# Patient Record
Sex: Male | Born: 1965 | Race: White | Hispanic: No | Marital: Married | Smoking: Former smoker
Health system: Southern US, Community
[De-identification: ages and names within clinical notes are randomized; demographics above are authoritative.]

## PROBLEM LIST (undated history)

## (undated) DIAGNOSIS — K900 Celiac disease: Secondary | ICD-10-CM

## (undated) DIAGNOSIS — K219 Gastro-esophageal reflux disease without esophagitis: Secondary | ICD-10-CM

## (undated) DIAGNOSIS — I10 Essential (primary) hypertension: Secondary | ICD-10-CM

## (undated) HISTORY — PX: SHOULDER ARTHROSCOPY WITH BICEPS TENDON REPAIR: SHX5674

## (undated) HISTORY — PX: HERNIA REPAIR: SHX51

## (undated) HISTORY — DX: Essential (primary) hypertension: I10

---

## 2001-04-05 ENCOUNTER — Encounter: Payer: Self-pay | Admitting: *Deleted

## 2001-04-05 ENCOUNTER — Encounter: Admission: RE | Admit: 2001-04-05 | Discharge: 2001-04-05 | Payer: Self-pay | Admitting: *Deleted

## 2001-07-01 ENCOUNTER — Ambulatory Visit (HOSPITAL_COMMUNITY): Admission: RE | Admit: 2001-07-01 | Discharge: 2001-07-01 | Payer: Self-pay | Admitting: *Deleted

## 2003-08-18 ENCOUNTER — Emergency Department (HOSPITAL_COMMUNITY): Admission: AD | Admit: 2003-08-18 | Discharge: 2003-08-18 | Payer: Self-pay | Admitting: Family Medicine

## 2004-06-24 ENCOUNTER — Ambulatory Visit (HOSPITAL_COMMUNITY): Admission: RE | Admit: 2004-06-24 | Discharge: 2004-06-24 | Payer: Self-pay | Admitting: Cardiology

## 2008-03-07 ENCOUNTER — Emergency Department (HOSPITAL_COMMUNITY): Admission: EM | Admit: 2008-03-07 | Discharge: 2008-03-07 | Payer: Self-pay | Admitting: Emergency Medicine

## 2008-09-19 ENCOUNTER — Ambulatory Visit (HOSPITAL_COMMUNITY): Admission: RE | Admit: 2008-09-19 | Discharge: 2008-09-19 | Payer: Self-pay | Admitting: *Deleted

## 2008-09-19 ENCOUNTER — Encounter (INDEPENDENT_AMBULATORY_CARE_PROVIDER_SITE_OTHER): Payer: Self-pay | Admitting: *Deleted

## 2008-12-27 ENCOUNTER — Encounter: Admission: RE | Admit: 2008-12-27 | Discharge: 2009-03-27 | Payer: Self-pay | Admitting: Internal Medicine

## 2009-10-17 IMAGING — CR DG CHEST 2V
2 series · 2 of 2 positions shown · non-contrast
Comparison: None

CLINICAL DATA: Chest pain.

CHEST - 2 VIEW

[w chest pa]
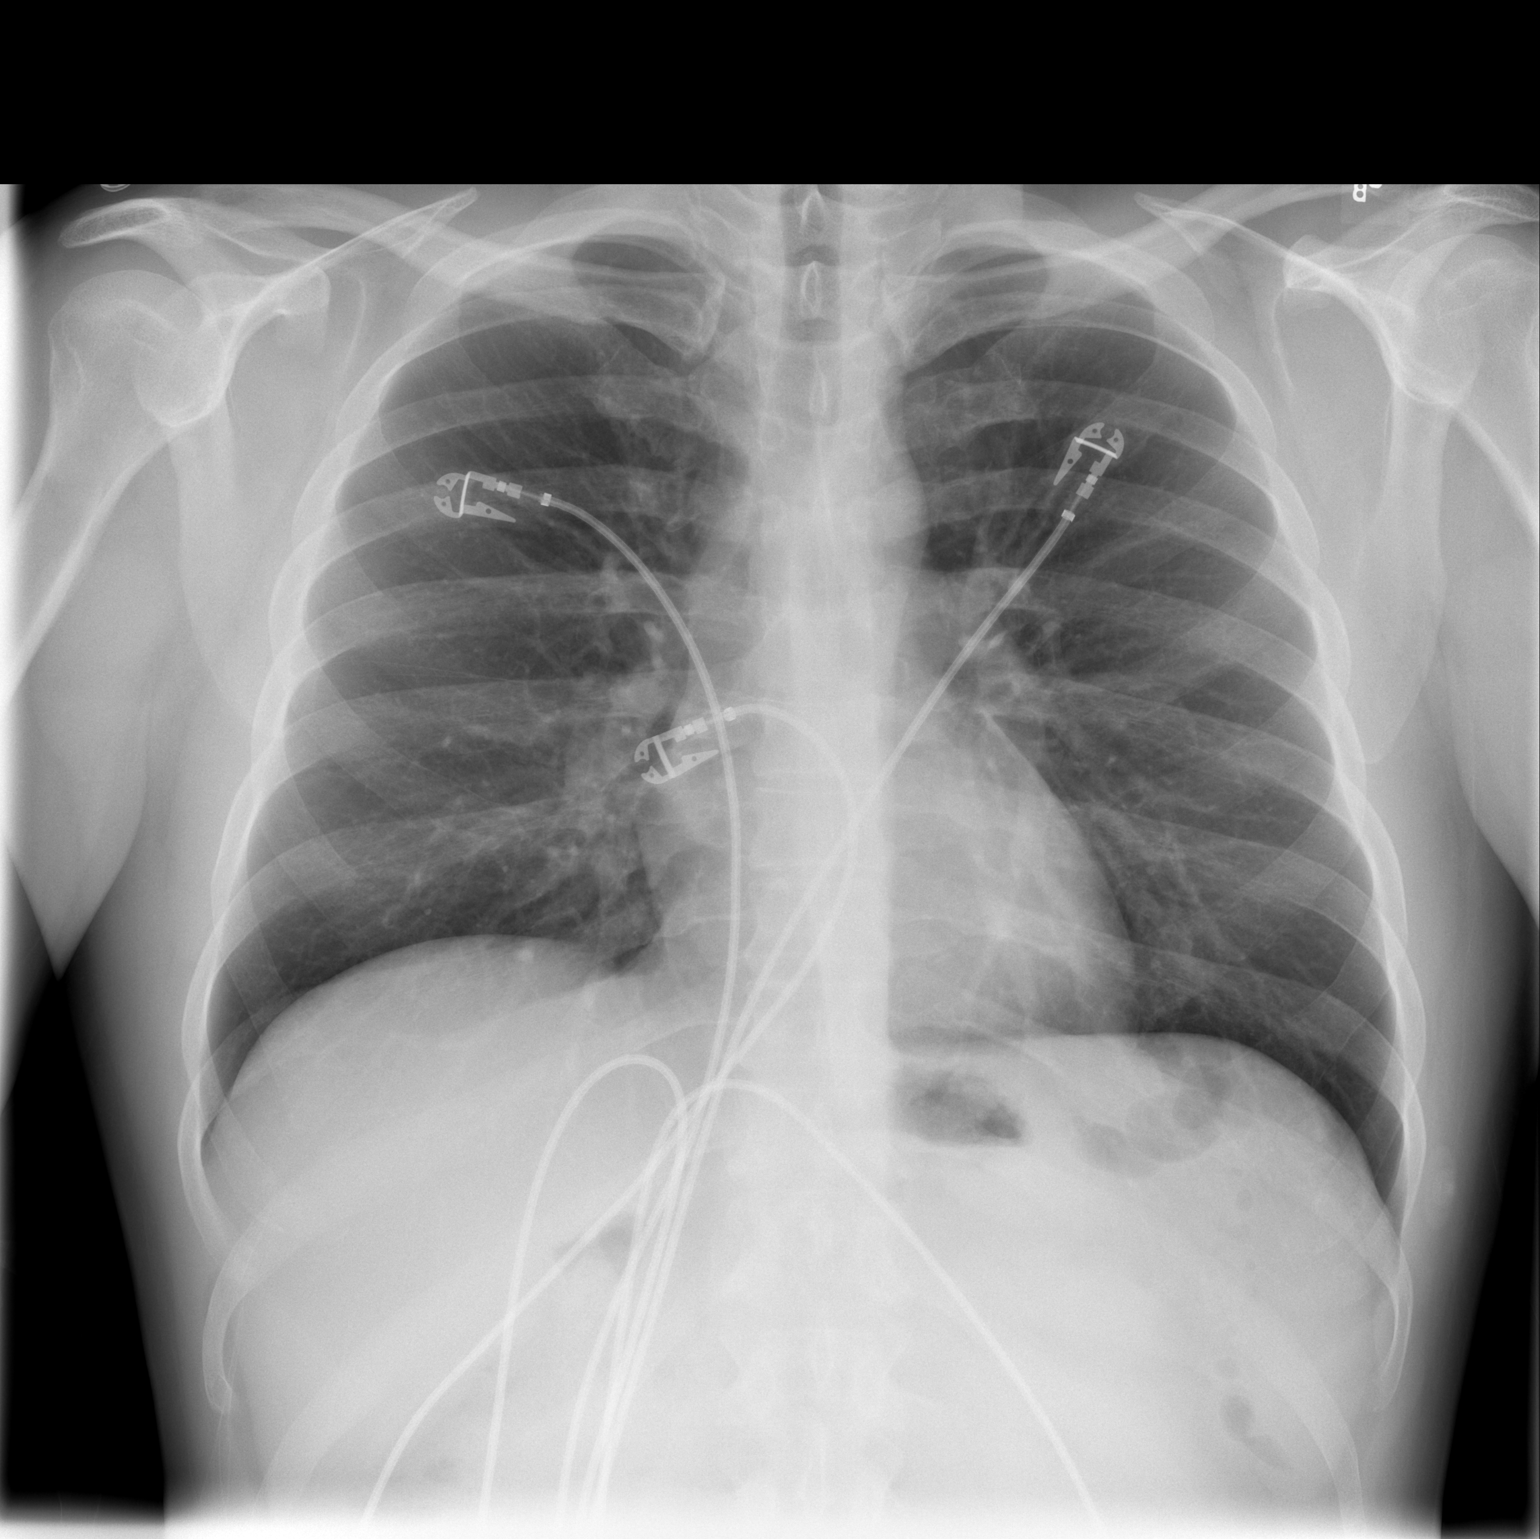

[w chest lat]
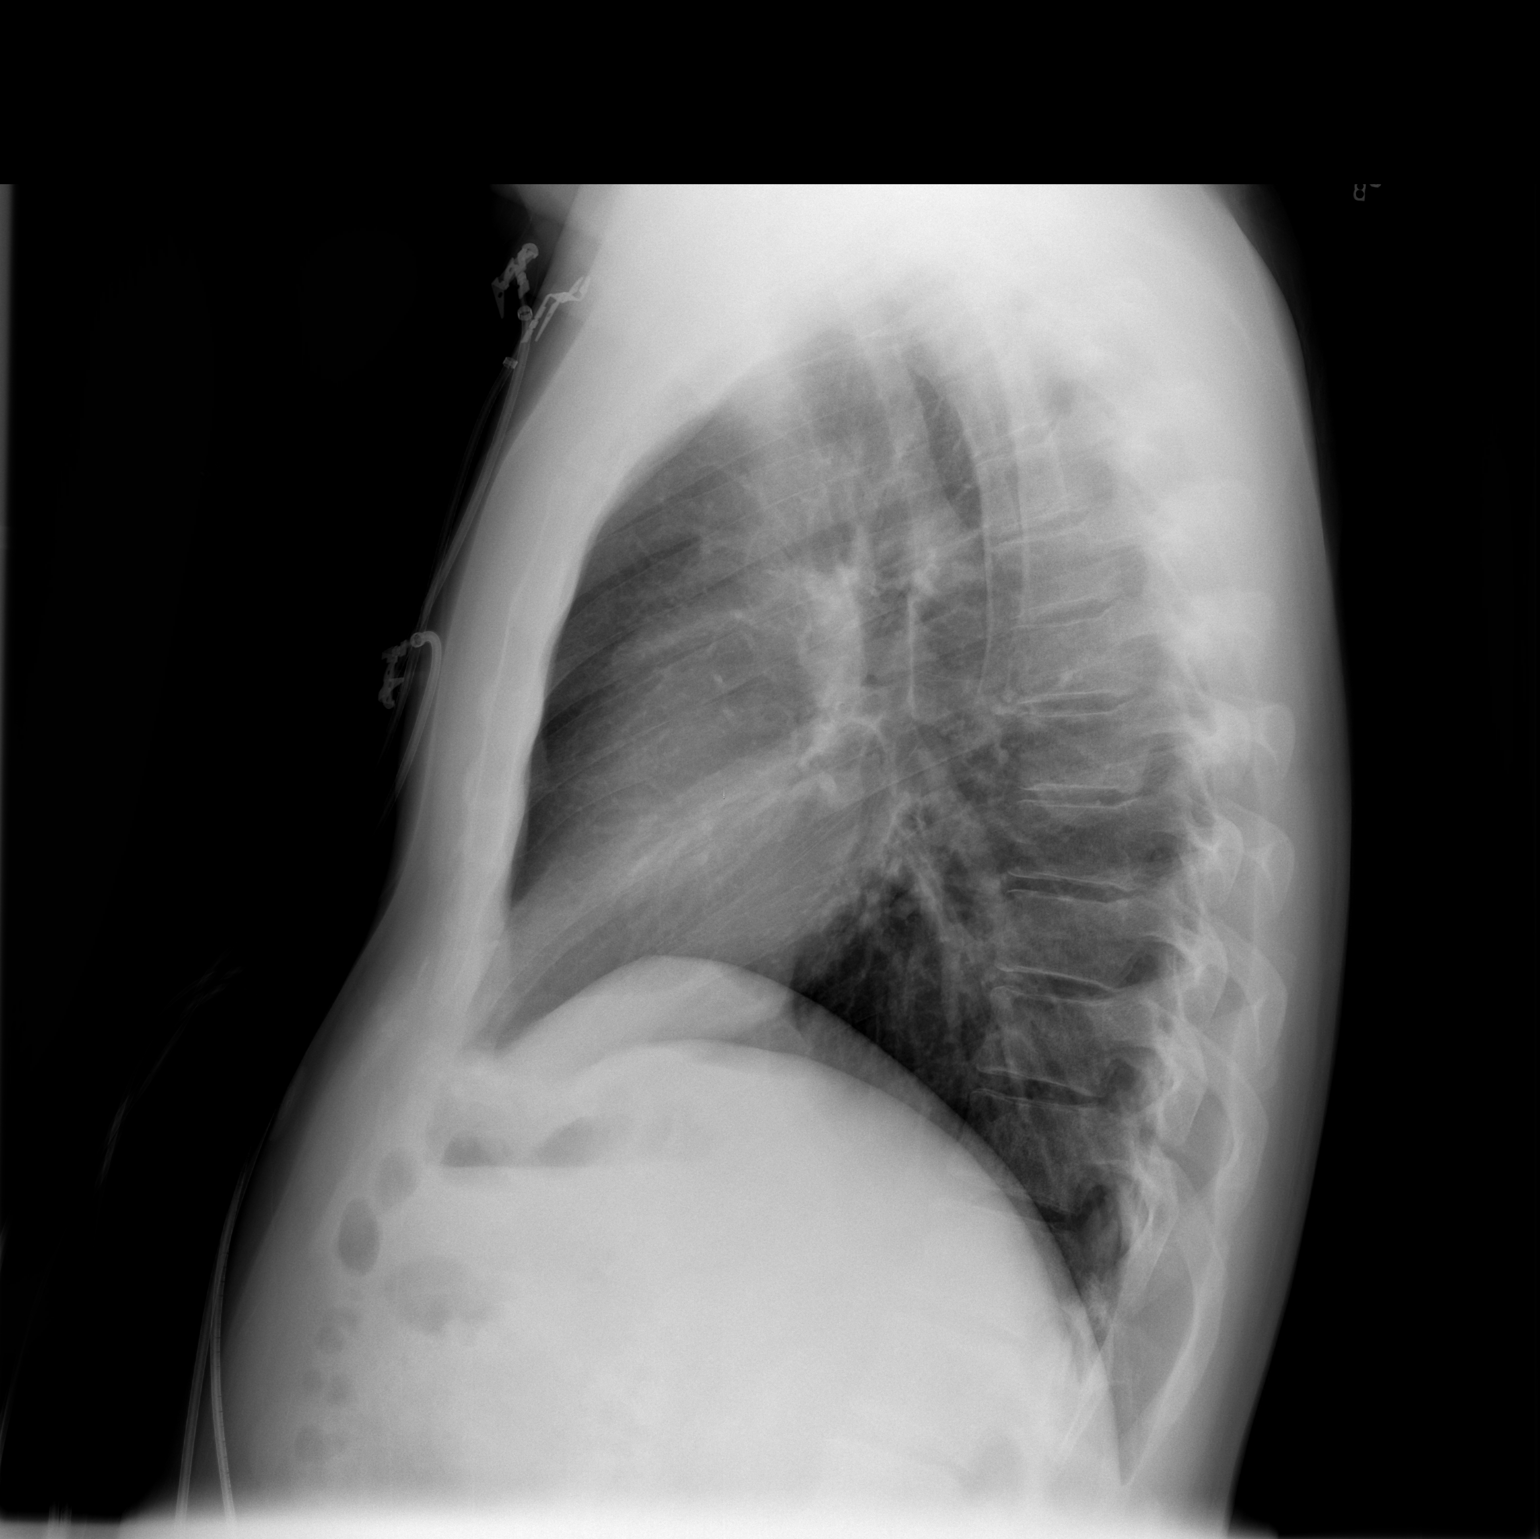

[2 of 2 positions shown; findings below may reference images not displayed]

FINDINGS: The heart size and mediastinal contours are within normal
limits.  Both lungs are clear.  The visualized skeletal structures
are unremarkable.
IMPRESSION: No active cardiopulmonary disease.

## 2010-10-08 NOTE — Op Note (Signed)
NAMEGERROD, MAULE           ACCOUNT NO.:  [id number]   MEDICAL RECORD NO.:  [id number]          PATIENT TYPE:  AMB   LOCATION:  ENDO                         FACILITY:  [facility]   PHYSICIAN:  Georgiana Spinner, M.D.    DATE OF BIRTH:  [birth year]   DATE OF PROCEDURE:  09/19/2008  DATE OF DISCHARGE:                               OPERATIVE REPORT   PROCEDURE:  Upper endoscopy with biopsy.   INDICATIONS:  Question of celiac disease, abdominal pain, weight loss.   ANESTHESIA:  Fentanyl 100 mcg, Versed 10 mg.   PROCEDURE:  With the patient mildly sedated in the left lateral  decubitus position, the Pentax videoscopic endoscope was inserted and  passed under direct vision through the esophagus which appeared normal  into the stomach.  Fundus, body, antrum appeared normal as did second  portion duodenum and distally.  We biopsied this area to rule out celiac  sprue.  From this point, the endoscope was slowly withdrawn taking  circumferential views of duodenal mucosa until the endoscope had been  pulled back into stomach placed in retroflexion to view the stomach from  below.  The endoscope was straightened and withdrawn taking  circumferential views of the remaining gastric and esophageal mucosa.  The patient's vital signs, pulse oximeter remained stable.  The patient  tolerated procedure well without apparent complications.   FINDINGS:  Mild duodenitis photographed only and biopsies of the distal  duodenum.  Await biopsy report.  The patient will call me for results  and follow-up with me as an outpatient.           ______________________________  Georgiana Spinner, M.D.     GMO/MEDQ  D:  09/19/2008  T:  09/19/2008  Job:  [id number]

## 2010-10-11 NOTE — Procedures (Signed)
[facility]  Patient:    Anthony Wilson, Anthony Wilson Visit Number: [id number] MRN: [id number]          Service Type: END Location: ENDO Attending Physician:  Sabino Gasser Dictated by:   Sabino Gasser, M.D. Proc. Date: 07/01/01 Admit Date:  07/01/2001                             Procedure Report  PROCEDURE:  Upper endoscopy.  INDICATION FOR PROCEDURE:  Abdominal pain.  ANESTHESIA:  Demerol 80, Versed 8 mg.  DESCRIPTION OF PROCEDURE:  With the patient mildly sedated in the left lateral decubitus position, the Olympus video endoscope was inserted in the mouth and passed under direct vision through the esophagus which appeared normal into the stomach. The fundus, body, antrum, duodenal bulb, and second portion of the duodenum all appeared normal. From this point, the endoscope was slowly withdrawn taking circumferential views of the entire duodenal mucosa until the endoscope was then pulled back into the stomach, placed in retroflexion to view the stomach from below. The endoscope was then straightened and withdrawn taking circumferential views the remaining gastric and esophageal mucosa all of which appeared normal. The patients vital signs and pulse oximeter remained stable. The patient tolerated the procedure well and there were no apparent complications.  FINDINGS:  Normal endoscopic examination.  IMPRESSION:  Probable irritable bowel syndrome. Will have the patient follow-up with me as an outpatient. Dictated by:   Sabino Gasser, M.D. Attending Physician:  Sabino Gasser DD:  07/01/01 TD:  07/02/01 Job: [id number]

## 2011-02-24 LAB — COMPREHENSIVE METABOLIC PANEL WITH GFR
CO2: 26
Calcium: 9.1
Creatinine, Ser: 0.89
GFR calc Af Amer: >60
GFR calc non Af Amer: >60
Glucose, Bld: 93
Sodium: 141
Total Protein: 7.1

## 2011-02-24 LAB — COMPREHENSIVE METABOLIC PANEL
ALT: 65 — ABNORMAL HIGH
AST: 31
Albumin: 4.4
Alkaline Phosphatase: 67
BUN: 18
Chloride: 107
Potassium: 4.4
Total Bilirubin: 0.4

## 2011-02-24 LAB — DIFFERENTIAL
Basophils Absolute: 0
Basophils Relative: 0
Eosinophils Absolute: 0
Eosinophils Relative: 0
Lymphocytes Relative: 16
Lymphs Abs: 1.1
Monocytes Absolute: 0.4
Monocytes Relative: 6
Neutro Abs: 5.5
Neutrophils Relative %: 77

## 2011-02-24 LAB — LIPASE, BLOOD: Lipase: 13

## 2011-02-24 LAB — POCT CARDIAC MARKERS
CKMB, poc: <1 — ABNORMAL LOW
Myoglobin, poc: 52.4
Troponin i, poc: <0.05

## 2011-02-24 LAB — CBC
HCT: 44.8
Hemoglobin: 14.9
MCHC: 33.2
MCV: 85.7
Platelets: 332
RBC: 5.23
RDW: 12.4
WBC: 7.1

## 2013-12-26 ENCOUNTER — Emergency Department (HOSPITAL_COMMUNITY): Payer: BC Managed Care – PPO

## 2013-12-26 ENCOUNTER — Encounter (HOSPITAL_COMMUNITY): Payer: Self-pay | Admitting: Emergency Medicine

## 2013-12-26 ENCOUNTER — Emergency Department (HOSPITAL_COMMUNITY)
Admission: EM | Admit: 2013-12-26 | Discharge: 2013-12-26 | Disposition: A | Discharge status: Home / Self Care | Payer: BC Managed Care – PPO | Attending: Emergency Medicine | Admitting: Emergency Medicine

## 2013-12-26 DIAGNOSIS — Z8719 Personal history of other diseases of the digestive system: Secondary | ICD-10-CM | POA: Insufficient documentation

## 2013-12-26 DIAGNOSIS — R079 Chest pain, unspecified: Secondary | ICD-10-CM | POA: Diagnosis not present

## 2013-12-26 HISTORY — DX: Celiac disease: K90.0

## 2013-12-26 LAB — BASIC METABOLIC PANEL
ANION GAP: 14 (ref 5–15)
BUN: 15 mg/dL (ref 6–23)
CHLORIDE: 105 meq/L (ref 96–112)
CO2: 26 meq/L (ref 19–32)
CREATININE: 0.86 mg/dL (ref 0.50–1.35)
Calcium: 9.7 mg/dL (ref 8.4–10.5)
GFR calc non Af Amer: >90 mL/min (ref >90)
Glucose, Bld: 108 mg/dL — ABNORMAL HIGH (ref 70–99)
POTASSIUM: 4.1 meq/L (ref 3.7–5.3)
SODIUM: 145 meq/L (ref 137–147)

## 2013-12-26 LAB — HEPATIC FUNCTION PANEL
ALK PHOS: 76 U/L (ref 39–117)
ALT: 41 U/L (ref 0–53)
AST: 25 U/L (ref 0–37)
Albumin: 4.3 g/dL (ref 3.5–5.2)
Bilirubin, Direct: <0.2 mg/dL (ref 0.0–0.3)
TOTAL PROTEIN: 7.7 g/dL (ref 6.0–8.3)
Total Bilirubin: 0.3 mg/dL (ref 0.3–1.2)

## 2013-12-26 LAB — I-STAT TROPONIN, ED: TROPONIN I, POC: 0 ng/mL (ref 0.00–0.08)

## 2013-12-26 LAB — CBC
HCT: 44.5 % (ref 39.0–52.0)
Hemoglobin: 15.2 g/dL (ref 13.0–17.0)
MCH: 28.4 pg (ref 26.0–34.0)
MCHC: 34.2 g/dL (ref 30.0–36.0)
MCV: 83.2 fL (ref 78.0–100.0)
PLATELETS: 327 10*3/uL (ref 150–400)
RBC: 5.35 MIL/uL (ref 4.22–5.81)
RDW: 13.1 % (ref 11.5–15.5)
WBC: 8.6 10*3/uL (ref 4.0–10.5)

## 2013-12-26 LAB — LIPASE, BLOOD: LIPASE: 11 U/L (ref 11–59)

## 2013-12-26 LAB — TROPONIN I

## 2013-12-26 MED ORDER — GI COCKTAIL ~~LOC~~
30.0000 mL | Freq: Once | ORAL | Status: AC
  Administered 2013-12-26: 30 mL via ORAL
  Filled 2013-12-26: qty 30

## 2013-12-26 MED ORDER — TRAMADOL HCL 50 MG PO TABS
50.0000 mg | ORAL_TABLET | Freq: Once | ORAL | Status: AC
  Administered 2013-12-26: 50 mg via ORAL
  Filled 2013-12-26: qty 1

## 2013-12-26 MED ORDER — PANTOPRAZOLE SODIUM 40 MG PO TBEC: 40.0000 mg | DELAYED_RELEASE_TABLET | Freq: Every day | ORAL | Status: AC

## 2013-12-26 MED ORDER — ASPIRIN 81 MG PO CHEW
324.0000 mg | CHEWABLE_TABLET | Freq: Once | ORAL | Status: AC
  Administered 2013-12-26: 324 mg via ORAL
  Filled 2013-12-26: qty 4

## 2013-12-26 NOTE — ED Notes (Signed)
C/o tightness to center of chest since 0030.  States he was unable to go to sleep due to pain.  Denies nausea, vomiting, and sob.

## 2013-12-26 NOTE — ED Provider Notes (Signed)
CSN: [id number]     Arrival date & time 12/26/13  0522 History   First MD Initiated Contact with Patient 12/26/13 [phone]     Chief Complaint  Patient presents with  . Chest Pain     (Consider location/radiation/quality/duration/timing/severity/associated sxs/prior Treatment) HPI 48 year old male presents to emergency room from home with complaint of chest pain.  Pain started around 12:30 last night as he was playing a game on his tablet.  Pain is center of chest and substernal.  No recent pain.  Pain is described as an ache.  It is been constant since onset.  He denies any nausea vomiting fever chills cough diaphoresis or shortness of breath.  No prior history of same.  Patient took famotidine and antacids without improvement in symptoms.  Patient reports grandmother may have had some heart trouble, no other known family history of heart disease.  Patient denies history of hypertension, diabetes, hyperlipidemia.   Past Medical History  Diagnosis Date  . Celiac disease    History reviewed. No pertinent past surgical history. No family history on file. History  Substance Use Topics  . Smoking status: Never Smoker   . Smokeless tobacco: Not on file  . Alcohol Use: Yes    Review of Systems   See History of Present Illness; otherwise all other systems are reviewed and negative  Allergies  Other  Home Medications   Prior to Admission medications   Medication Sig Start Date End Date Taking? Authorizing Provider  calcium carbonate (TUMS - DOSED IN MG ELEMENTAL CALCIUM) 500 MG chewable tablet Chew 2 tablets by mouth every 3 (three) hours as needed for indigestion or heartburn.   Yes Historical Provider, MD  famotidine (PEPCID) 40 MG tablet Take 40 mg by mouth once.   Yes Historical Provider, MD   BP 152/83  Temp(Src) 97.5 F (36.4 C) (Oral)  Resp 20  SpO2 99% Physical Exam  Nursing note and vitals reviewed. Constitutional: He is oriented to person, place, and time. He appears  well-developed and well-nourished.  HENT:  Head: Normocephalic and atraumatic.  Nose: Nose normal.  Mouth/Throat: Oropharynx is clear and moist.  Eyes: Conjunctivae and EOM are normal. Pupils are equal, round, and reactive to light.  Neck: Normal range of motion. Neck supple. No JVD present. No tracheal deviation present. No thyromegaly present.  Cardiovascular: Normal rate, regular rhythm, normal heart sounds and intact distal pulses.  Exam reveals no gallop and no friction rub.   No murmur heard. Pulmonary/Chest: Effort normal and breath sounds normal. No stridor. No respiratory distress. He has no wheezes. He has no rales. He exhibits no tenderness.  Abdominal: Soft. Bowel sounds are normal. He exhibits no distension and no mass. There is no tenderness. There is no rebound and no guarding.  Musculoskeletal: Normal range of motion. He exhibits no edema and no tenderness.  Lymphadenopathy:    He has no cervical adenopathy.  Neurological: He is alert and oriented to person, place, and time. He exhibits normal muscle tone. Coordination normal.  Skin: Skin is warm and dry. No rash noted. No erythema. No pallor.  Psychiatric: He has a normal mood and affect. His behavior is normal. Judgment and thought content normal.    ED Course  Procedures (including critical care time) Labs Review Labs Reviewed  CBC  BASIC METABOLIC PANEL  HEPATIC FUNCTION PANEL  LIPASE, BLOOD  I-STAT TROPOININ, ED    Imaging Review No results found.   EKG Interpretation   Date/Time:  Monday December 26 2013  05:35:38 EDT Ventricular Rate:  87 PR Interval:  140 QRS Duration: 88 QT Interval:  348 QTC Calculation: 418 R Axis:   -14 Text Interpretation:  Normal sinus rhythm Low voltage QRS Borderline ECG  No significant change since last tracing Confirmed by Rc Amison  MD, Tameeka Luo  ([id number]) on 12/26/2013 5:54:17 AM      MDM   Final diagnoses:  None   48 year old male with nearly 6 hours of constant aching chest  pain.  Pain seems atypical in nature.  Plan for labs, and chest x-ray.  EKG without ST elevation.  Has pain has been ongoing for 6 hours straight, feel that 1 troponin is all that is needed.  We'll give aspirin and GI cocktail to see if that helps with symptoms.  Care passed to Dr Denton LankSteinl at change of shift awaiting cxr results and resolution of pain  Olivia Mackielga M Taneia Mealor, MD 12/26/13 2114

## 2013-12-26 NOTE — ED Provider Notes (Signed)
Pt signed out by Dr Norlene Campbelltter at 0700, if repeat trop neg to d/c to home.  Pt w approx 10 hours constant, midline, not pleuritic cp  - initial and delta trop both normal. cxr neg acute.   Symptoms currently improved resolved. No sob/dyspnea. Vitals normal. Pt appears stable for d/c.     Suzi RootsKevin E Tashari Schoenfelder, MD 12/26/13 [phone]

## 2013-12-26 NOTE — ED Notes (Signed)
Patient in NAD at time of d/c,  

## 2013-12-26 NOTE — Discharge Instructions (Signed)
Take protonix (acid blocker medication).   You may also try pepcid and maalox as need if gi symptoms/reflux/heartburn. For chest discomfort, follow up with cardiologist in coming week - see referral - call office to arrange appointment. Return to ER if worse, persistent or recurrent chest pain, trouble breathing, other concern.  You were given pain medication in the ER - no driving for the next 4 hours.     Chest Pain (Nonspecific) It is often hard to give a specific diagnosis for the cause of chest pain. There is always a chance that your pain could be related to something serious, such as a heart attack or a blood clot in the lungs. You need to follow up with your health care provider for further evaluation. CAUSES   Heartburn.  Pneumonia or bronchitis.  Anxiety or stress.  Inflammation around your heart (pericarditis) or lung (pleuritis or pleurisy).  A blood clot in the lung.  A collapsed lung (pneumothorax). It can develop suddenly on its own (spontaneous pneumothorax) or from trauma to the chest.  Shingles infection (herpes zoster virus). The chest wall is composed of bones, muscles, and cartilage. Any of these can be the source of the pain.  The bones can be bruised by injury.  The muscles or cartilage can be strained by coughing or overwork.  The cartilage can be affected by inflammation and become sore (costochondritis). DIAGNOSIS  Lab tests or other studies may be needed to find the cause of your pain. Your health care provider may have you take a test called an ambulatory electrocardiogram (ECG). An ECG records your heartbeat patterns over a 24-hour period. You may also have other tests, such as:  Transthoracic echocardiogram (TTE). During echocardiography, sound waves are used to evaluate how blood flows through your heart.  Transesophageal echocardiogram (TEE).  Cardiac monitoring. This allows your health care provider to monitor your heart rate and rhythm in  real time.  Holter monitor. This is a portable device that records your heartbeat and can help diagnose heart arrhythmias. It allows your health care provider to track your heart activity for several days, if needed.  Stress tests by exercise or by giving medicine that makes the heart beat faster. TREATMENT   Treatment depends on what may be causing your chest pain. Treatment may include:  Acid blockers for heartburn.  Anti-inflammatory medicine.  Pain medicine for inflammatory conditions.  Antibiotics if an infection is present.  You may be advised to change lifestyle habits. This includes stopping smoking and avoiding alcohol, caffeine, and chocolate.  You may be advised to keep your head raised (elevated) when sleeping. This reduces the chance of acid going backward from your stomach into your esophagus. Most of the time, nonspecific chest pain will improve within 2-3 days with rest and mild pain medicine.  HOME CARE INSTRUCTIONS   If antibiotics were prescribed, take them as directed. Finish them even if you start to feel better.  For the next few days, avoid physical activities that bring on chest pain. Continue physical activities as directed.  Do not use any tobacco products, including cigarettes, chewing tobacco, or electronic cigarettes.  Avoid drinking alcohol.  Only take medicine as directed by your health care provider.  Follow your health care provider's suggestions for further testing if your chest pain does not go away.  Keep any follow-up appointments you made. If you do not go to an appointment, you could develop lasting (chronic) problems with pain. If there is any problem keeping an appointment,  call to reschedule. SEEK MEDICAL CARE IF:   Your chest pain does not go away, even after treatment.  You have a rash with blisters on your chest.  You have a fever. SEEK IMMEDIATE MEDICAL CARE IF:   You have increased chest pain or pain that spreads to your arm,  neck, jaw, back, or abdomen.  You have shortness of breath.  You have an increasing cough, or you cough up blood.  You have severe back or abdominal pain.  You feel nauseous or vomit.  You have severe weakness.  You faint.  You have chills. This is an emergency. Do not wait to see if the pain will go away. Get medical help at once. Call your local emergency services (911 in [place] Do not drive yourself to the hospital. MAKE SURE YOU:   Understand these instructions.  Will watch your condition.  Will get help right away if you are not doing well or get worse. Document Released: 02/19/2005 Document Revised: 05/17/2013 Document Reviewed: 12/16/2007 [facility] Patient Information 2015 [place], [place]. This information is not intended to replace advice given to you by your health care provider. Make sure you discuss any questions you have with your health care provider.

## 2013-12-26 NOTE — ED Notes (Signed)
Pt reports onset of chest tightness approx 00:30 while at rest - states he thought it might have been indigestion so he took an antacid however no relief.

## 2015-08-07 IMAGING — CR DG CHEST 2V
2 series · 2 of 2 positions shown · non-contrast
Comparison: 03/07/2008

CLINICAL DATA: Chest pain

EXAM:
CHEST  2 VIEW

[w chest pa]
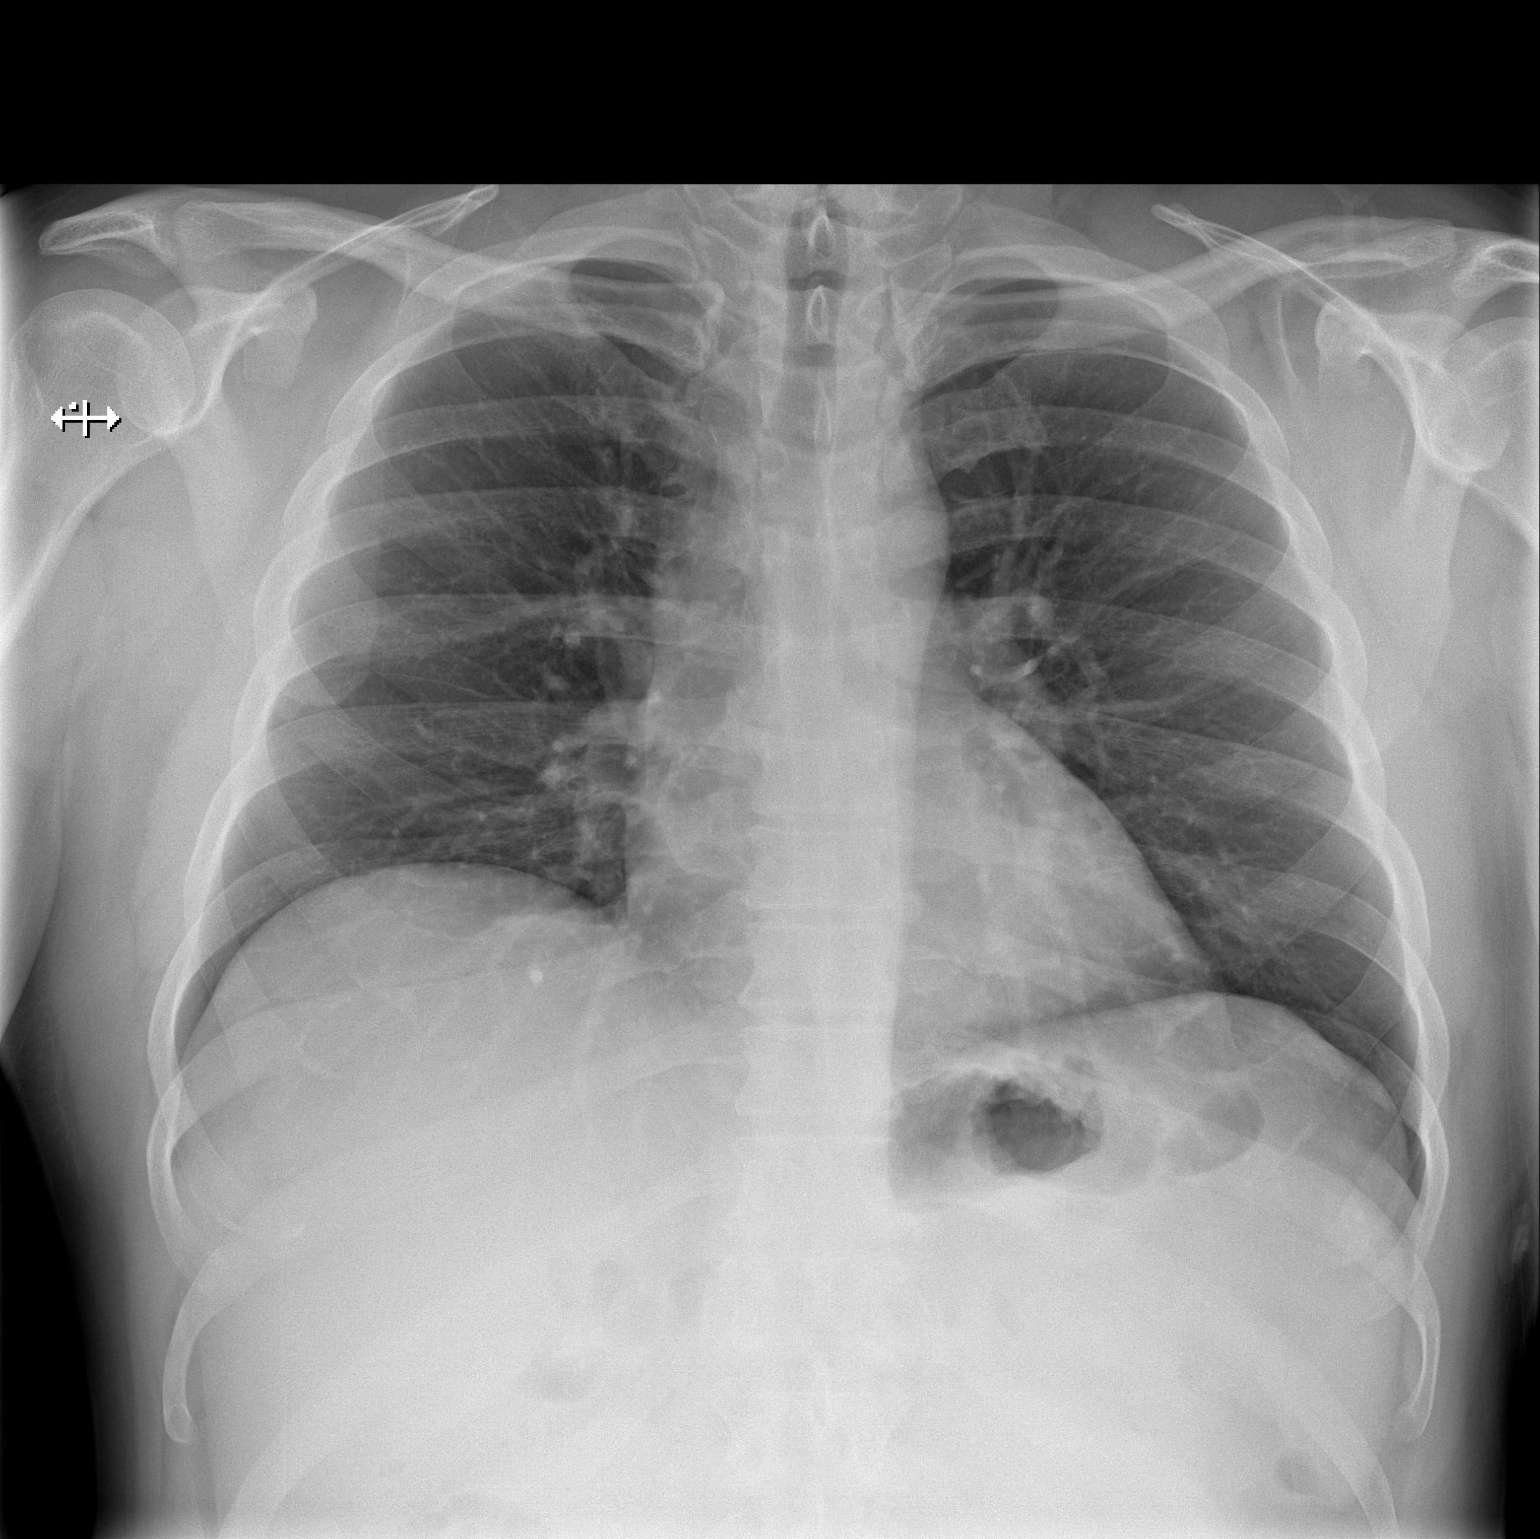

[w chest lat]
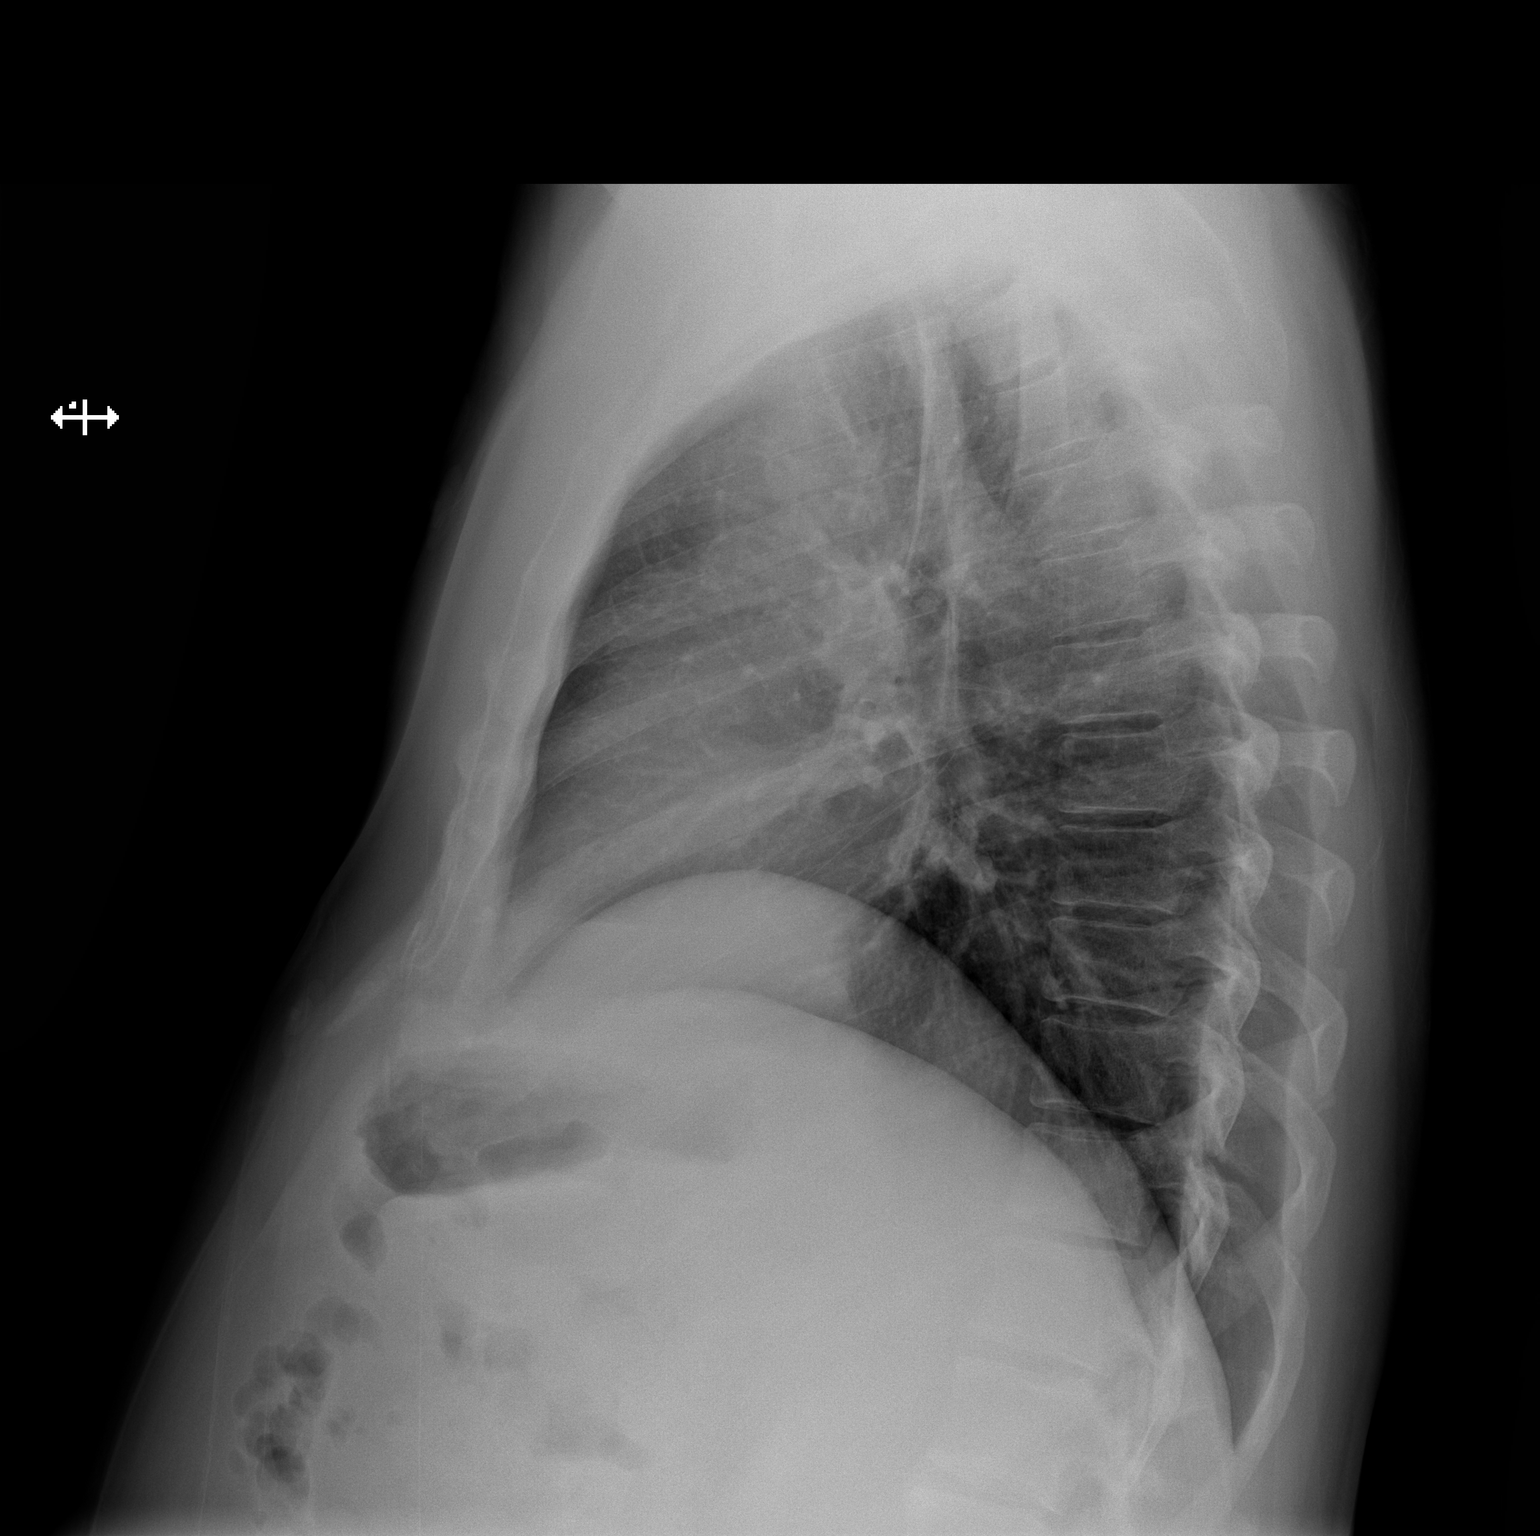

[2 of 2 positions shown; findings below may reference images not displayed]

FINDINGS: The heart size and mediastinal contours are within normal limits.
Both lungs are clear. The visualized skeletal structures are
unremarkable.
IMPRESSION: No active cardiopulmonary disease.

## 2016-11-10 ENCOUNTER — Ambulatory Visit (INDEPENDENT_AMBULATORY_CARE_PROVIDER_SITE_OTHER): Payer: No Typology Code available for payment source | Admitting: Orthopaedic Surgery

## 2016-11-10 ENCOUNTER — Encounter (INDEPENDENT_AMBULATORY_CARE_PROVIDER_SITE_OTHER): Payer: Self-pay | Admitting: Orthopaedic Surgery

## 2016-11-10 VITALS — BP 137/73 | HR 80 | Temp 98.3°F

## 2016-11-10 DIAGNOSIS — S86011A Strain of right Achilles tendon, initial encounter: Secondary | ICD-10-CM

## 2016-11-10 NOTE — Progress Notes (Signed)
[facility]    Provider: Hermelinda Dellen, MD  Date of Exam:  11/10/2016   Patient:  Raymond Austin  DOB:  01/29/1966    AGE:  51 y.o.  MR#:  [id number]     Chief Complaint: Right ankle injury/pain.    HPI:  Raymond Austin is a pleasant 51 y.o.-year-old male who presents with a history of a Right ankle injury that occurred on 11/07/16.  Raymond Austin injured his ankle by turning around to get a soccer balland felt a pop in his ankle..  Immediately following the injury he developed moderate bruising and swelling. He was seen in an urgent care and splinted for an achilles tendon tear. He has attempted the use of protected weight bearing, ice and elevation with mild relief of his symptoms.  Raymond Austin has no prior history of injury to his Right ankle. He has not had a history of surgery on this ankle.  Raymond Austin is now here for further evaluation and discussion of treatment options.    For other past medical history, social history, family history and past surgical history please see them listed below.    Club/School/Work Affiliation: Wife Raymond Austin     Problem List: There is no problem list on file for this patient.      Past Medical History:    Past Medical History:   Diagnosis Date   . Hypertension        Social History:   Social History   Substance Use Topics   . Smoking status: Current Every Day Smoker   . Smokeless tobacco: Never Used   . Alcohol use Yes       Family History: History reviewed. No pertinent family history.    Past Surgical History:    Past Surgical History:   Procedure Laterality Date   . HERNIA REPAIR         Medications:  Scheduled Meds:  Continuous Infusions:  PRN Meds:.     Allergies:    Allergies   Allergen Reactions   . Vicodin [Hydrocodone-Acetaminophen]        ROS:  Constitutional: No fatigue, fever, weight loss, or weight gain.   Ears, Nose, Mouth & Throat: No sore throat or hearing loss.   Cardiovascular: No chest pain, blood  clots, or leg cramps.   Respiratory: No shortness of breath, cough, or difficulty breathing.   Gastrointestinal: No nausea, vomiting, diarrhea, or loss of appetite.   Genitourinary: No polyuria or kidney disease.   Musculoskeletal: No joint aches, muscle weakness or swelling of joints/body parts other than that mentioned above/below.  Integumentary: No finger nail changes or skin dryness.   Neurological: No numbness, burning discomfort, or headaches.   Psychiatric: No depression or anxiety.   Endocrine: No increased thirst, change in appetite or thyroid disease.   Hematologic/Lymphatic: No easy bruising or anemia.     EXAM: Right Ankle Exam:  Skin intact  Erythema: None present  Swelling: moderate around achilles  Effusion: None  Deformities: None    ROM: PF: 0-15    Anterior Drawer: WNL  Talar Tilt: WNL  Tinnel Sign: Negative  Thompson Test: positive     Medial Malleolus TTP: None  Lateral Malleolus TTP: None  ATFL TTP: None  Deltoid Ligament TTP: None  Base of the 5th MT TTP: None  Achilles insertion TTP: None  Mid-Achilles tendon TTP: None    Other areas of Tenderness: TTP musculotendinous achilles tendon     Strength: WNL  DTR and Pathological Reflexes Intact  No lymphadenopathy  Sensation Intact to light touch all distributions of leg and foot  DF, PF, EHL motor intact  Foot Perfused with CR <2 sec  DP/PT pulse 2+    Left Ankle Exam:  Skin intact  Erythema: None present  Swelling: None present  Effusion: None  Deformities: None    ROM: WNL    Anterior Drawer: WNL  Talar Tilt: WNL  Tinnel Sign: Negative  Thompson Test: Negative    Medial Malleolus TTP: None  Lateral Malleolus TTP: None  ATFL TTP: None  Deltoid Ligament TTP: None  Base of the 5th MT TTP: None  Achilles insertion TTP: None  Mid-Achilles tendon TTP: None    Other areas of Tenderness: None    Strength: WNL    DTR and Pathological Reflexes Intact  No lymphadenopathy  Sensation Intact to light touch all distributions of leg and foot  DF, PF, EHL  motor intact  Foot Perfused with CR <2 sec  DP/PT pulse 2+      Vitals: BP 137/73   Pulse 80   Temp 98.3 F (36.8 C) (Oral)     General: Raymond Austin was pleasant, oriented, easily engaged, displayed logical thinking with clear speech and was neat in appearance. His general appearance was normal, well-developed and well-nourished. He was comfortable in the presence of his wife.    Gait: The patient demonstrated non antalgic gait with intact coordination and balan NWB R side on crutches.     STUDIES: None were done     ASSESSMENT/PLAN: Jeshua is a pleasant 51 y.o. year old with a right achilles tendon tear. At this time I'd like to get an MRI STAT for surgical planning. We will plan on proceeding with an achilles tendon repair this week but I'd like to be sure of the involvement of musculotendinous attachment. This needs to be fixed relatively quickly. He will remain NWB in a boot until then.  The patient will notify my clinic with any new concerns, changes or worsening of symptoms.  The patient was given standard infectious and DVT precautions. The patient will follow up Thursday for surgery. .    More than 30 minutes was spent with the patient at today's visit with more than 50% of that time spent discussing the patient's diagnosis, alternative treatments, potential outcomes and recommended treatment plan.

## 2016-11-11 ENCOUNTER — Other Ambulatory Visit (INDEPENDENT_AMBULATORY_CARE_PROVIDER_SITE_OTHER): Payer: Self-pay

## 2016-11-11 ENCOUNTER — Other Ambulatory Visit (INDEPENDENT_AMBULATORY_CARE_PROVIDER_SITE_OTHER): Payer: Self-pay | Admitting: Orthopaedic Surgery

## 2016-11-11 DIAGNOSIS — S86011A Strain of right Achilles tendon, initial encounter: Secondary | ICD-10-CM

## 2016-11-11 MED ORDER — OXYCODONE-ACETAMINOPHEN 5-325 MG PO TABS
1.0000 | ORAL_TABLET | ORAL | 0 refills | Status: DC | PRN
Start: 2016-11-11 — End: 2016-12-01

## 2016-11-11 MED ORDER — ONDANSETRON 4 MG PO TBDP
4.0000 mg | ORAL_TABLET | Freq: Three times a day (TID) | ORAL | 0 refills | Status: DC | PRN
Start: 2016-11-11 — End: 2016-11-24

## 2016-11-13 ENCOUNTER — Encounter (INDEPENDENT_AMBULATORY_CARE_PROVIDER_SITE_OTHER): Payer: Self-pay | Admitting: Orthopaedic Surgery

## 2016-11-13 DIAGNOSIS — S86011A Strain of right Achilles tendon, initial encounter: Secondary | ICD-10-CM

## 2016-11-20 ENCOUNTER — Encounter (INDEPENDENT_AMBULATORY_CARE_PROVIDER_SITE_OTHER): Payer: Self-pay | Admitting: Orthopaedic Surgery

## 2016-11-24 ENCOUNTER — Encounter (INDEPENDENT_AMBULATORY_CARE_PROVIDER_SITE_OTHER): Payer: Self-pay | Admitting: Family Nurse Practitioner

## 2016-11-24 ENCOUNTER — Ambulatory Visit (INDEPENDENT_AMBULATORY_CARE_PROVIDER_SITE_OTHER): Payer: No Typology Code available for payment source | Admitting: Family Nurse Practitioner

## 2016-11-24 VITALS — BP 140/88 | HR 86 | Ht 73.0 in | Wt 260.0 lb

## 2016-11-24 DIAGNOSIS — Z9889 Other specified postprocedural states: Secondary | ICD-10-CM

## 2016-11-24 DIAGNOSIS — S86011D Strain of right Achilles tendon, subsequent encounter: Secondary | ICD-10-CM

## 2016-11-24 DIAGNOSIS — S86011A Strain of right Achilles tendon, initial encounter: Secondary | ICD-10-CM

## 2016-11-24 NOTE — Progress Notes (Signed)
[facility]    Provider: Angela Adam, FNP  Date of Exam:  11/24/2016   Patient:  Raymond Austin  DOB:  12-Apr-1966    AGE:  51 y.o.  MR#:  [id number]     Diagnosis: s/p right Achilles tendon repair on 11/13/2016    HPI:  Zac returns, 11 days status post right Achilles tendon rupture repair performed by Dr. Beverely Pace.  Patient states that he is doing overall well, and that he is only needing Motrin throughout the day.  He states that he has been taking oxycodone 5 mg when necessary severe pain at nighttime, and that he has continued the deep vein thrombosis prophylaxis regimen using aspirin 325 mg. .  Patient reports that he has been nonweightbearing in his postoperative splint and has been utilizing a knee scooter for mobility.  He reports that he has lost his balance several times and had to put full weight on the affected extremity, but denies feeling a pop, increased pain, numbness, tingling, or weakness.  He is here today for his 1st postoperative visit and has no specific questions or concerns.    For other past medical history, social history, family history and past surgical history please see them listed below.      Problem List: There is no problem list on file for this patient.      Past Medical History:    Past Medical History:   Diagnosis Date   . Hypertension        Social History:   Social History   Substance Use Topics   . Smoking status: Current Every Day Smoker   . Smokeless tobacco: Never Used   . Alcohol use Yes       Family History: History reviewed. No pertinent family history.    Past Surgical History:    Past Surgical History:   Procedure Laterality Date   . HERNIA REPAIR         Medications:  Aspirin 325 mg BID DVT prophylaxis, oxicodone 5 mg PRN severe pain at night     Allergies:    Allergies   Allergen Reactions   . Vicodin [Hydrocodone-Acetaminophen]        ROS:  Constitutional: No fatigue, fever, weight loss, or weight gain.   Ears,  Nose, Mouth & Throat: No sore throat or hearing loss.   Cardiovascular: No chest pain, blood clots, or leg cramps.   Respiratory: No shortness of breath, cough, or difficulty breathing.   Gastrointestinal: No nausea, vomiting, diarrhea, or loss of appetite.   Genitourinary: No polyuria or kidney disease.   Musculoskeletal: No joint aches, muscle weakness or swelling of joints/body parts other than that mentioned above.  Integumentary: No finger nail changes or skin dryness.   Neurological: No numbness, burning discomfort, or headaches.   Psychiatric: No depression or anxiety.   Endocrine: No increased thirst, change in appetite or thyroid disease.   Hematologic/Lymphatic: No easy bruising or anemia.     EXAM:   Right Lower Extremity:  Incisions are in appropriate stages of healing and are clean, dry and intact.  Minimal swelling and ecchymosis  Non-tender to palpation about the incision.  Tolerates gentle PROM of ankle  Thompson test does not elicit plantar flexion  No palpable defect about the repair  Patellar DTR and Pathological Reflexes Intact  No lymphadenopathy  Sensation Intact to light touch all distributions of leg and foot  EHL motor intact  Foot Perfused with CR <2 sec  DP/PT pulse 2+    Vitals: BP 140/88   Pulse 86   Ht 1.854 m (6\' 1" )   Wt 117.9 kg (260 lb)   BMI 34.30 kg/m     General: Mallory was alert, oriented, easily engaged, displayed logical thinking with clear speech and was neat in appearance. His general appearance was normal, well-developed and well-nourished.     Gait: The patient is non-weightbearing      STUDIES: No new studies obtained on today's visit.     ASSESSMENT/PLAN: Sutures were removed and wound care instructions and precautions were given. Patient was instructed on the dos and don'ts regarding protection of the surgical procedure, as well as appropriate activity levels going forward.  He was transitioned to a CAM boot with heel lift at today's visit to clinic and will  remain NWB. The patient will notify my clinic of any changes or worsening of their symptoms during the interim. We will plan on follow up in 1 week as scheduled with Dr. Beverely Pace.  All the patient's questions were answered appropriately and completely. Gaspare understands the plan.

## 2016-12-01 ENCOUNTER — Encounter (INDEPENDENT_AMBULATORY_CARE_PROVIDER_SITE_OTHER): Payer: Self-pay | Admitting: Orthopaedic Surgery

## 2016-12-01 ENCOUNTER — Ambulatory Visit (INDEPENDENT_AMBULATORY_CARE_PROVIDER_SITE_OTHER): Payer: No Typology Code available for payment source | Admitting: Orthopaedic Surgery

## 2016-12-01 VITALS — BP 152/81 | HR 91 | Temp 98.1°F

## 2016-12-01 DIAGNOSIS — Z4789 Encounter for other orthopedic aftercare: Secondary | ICD-10-CM

## 2016-12-01 DIAGNOSIS — Z9889 Other specified postprocedural states: Secondary | ICD-10-CM

## 2016-12-01 NOTE — Progress Notes (Signed)
[facility]    Provider: Hermelinda Dellen, MD  Date of Exam:  12/01/2016   Patient:  Raymond Austin  DOB:  05-24-1966    AGE:  51 y.o.  MR#:  [id number]     Diagnosis: s/p right Achilles tendon repair on 11/13/2016    HPI:  Yesenia returns, 2 weeks status post right knee achilles tendon repair. He presents NWB with a scooter and walking boot. He has not started PT. He has recalled putting 50% bady weight on it and "knocking it around a few times". He has not attempted ROM. No reported fever chills and nausea.     For other past medical history, social history, family history and past surgical history please see them listed below.    Club/School/Work Affiliation: None    Problem List: There is no problem list on file for this patient.      Past Medical History:    Past Medical History:   Diagnosis Date   . Hypertension        Social History:   Social History   Substance Use Topics   . Smoking status: Current Every Day Smoker   . Smokeless tobacco: Never Used   . Alcohol use Yes       Family History: History reviewed. No pertinent family history.    Past Surgical History:    Past Surgical History:   Procedure Laterality Date   . HERNIA REPAIR         Medications:  Scheduled Meds:  Continuous Infusions:  PRN Meds:.     Allergies:    Allergies   Allergen Reactions   . Vicodin [Hydrocodone-Acetaminophen]        ROS:  Constitutional: No fatigue, fever, weight loss, or weight gain.   Ears, Nose, Mouth & Throat: No sore throat or hearing loss.   Cardiovascular: No chest pain, blood clots, or leg cramps.   Respiratory: No shortness of breath, cough, or difficulty breathing.   Gastrointestinal: No nausea, vomiting, diarrhea, or loss of appetite.   Genitourinary: No polyuria or kidney disease.   Musculoskeletal: No joint aches, muscle weakness or swelling of joints/body parts other than that mentioned above.  Integumentary: No finger nail changes or skin dryness.   Neurological: No  numbness, burning discomfort, or headaches.   Psychiatric: No depression or anxiety.   Endocrine: No increased thirst, change in appetite or thyroid disease.   Hematologic/Lymphatic: No easy bruising or anemia.     EXAM:   Right ankle:  Incisions are well-healed, clean, dry and intact.  Able to actively plantarflex foot  No significant effusion.  No tenderness to palpation.   Mild calf atrophy.  No other areas of tenderness.  DTR and Pathological Reflexes Intact  No lymphadenopathy  Sensation Intact to light touch all distributions of leg and foot  DF, PF, EHL motor intact  Foot Perfused with CR <2 sec  DP/PT pulse 2+      Vitals: BP 152/81   Pulse 91   Temp 98.1 F (36.7 C) (Oral)     General: Voyd was alert, oriented, easily engaged, displayed logical thinking with clear speech and was neat in appearance. His general appearance was normal, well-developed and well-nourished.     Gait: The patient demonstrated non antalgic gait with intact coordination and balance NWB with scooter.    STUDIES: No new studies obtained on today's visit.     ASSESSMENT/PLAN: Sutures were removed and wound care instructions and precautions were  given. The patient will continue physical therapy and therapeutic exercises per protocol.   Patient was instructed on the dos and don'ts regarding protection of the surgical procedure, as well as appropriate activity levels going forward. Michoel understands the importance of a dedicated therapy program. The patient will notify my clinic of any changes or worsening of their symptoms during the interim. We will plan on follow up in 4 weeks.  We have reviewed the operative pictures with Jak and all the patient's questions were answered appropriately and completely. Radames understands the plan.

## 2016-12-15 ENCOUNTER — Ambulatory Visit (INDEPENDENT_AMBULATORY_CARE_PROVIDER_SITE_OTHER): Payer: No Typology Code available for payment source | Admitting: Orthopaedic Surgery

## 2016-12-15 ENCOUNTER — Encounter (INDEPENDENT_AMBULATORY_CARE_PROVIDER_SITE_OTHER): Payer: Self-pay | Admitting: Orthopaedic Surgery

## 2016-12-15 VITALS — BP 146/82 | HR 98 | Temp 98.2°F

## 2016-12-15 DIAGNOSIS — Z9889 Other specified postprocedural states: Secondary | ICD-10-CM

## 2016-12-15 DIAGNOSIS — Z4789 Encounter for other orthopedic aftercare: Secondary | ICD-10-CM

## 2016-12-15 NOTE — Progress Notes (Signed)
[facility]    Provider: Hermelinda Dellen, MD  Date of Exam:  12/15/2016   Patient:  Raymond Austin  DOB:  Jan 19, 1966    AGE:  51 y.o.  MR#:  [id number]     Diagnosis: s/p 11/13/2016 Right Achilles tendon repair    HPI:  Raymond Austin returns, 4 weeks status post right Achilles tendon repair as described above. He is doing well overall and denies any pain at this point post-op. He is no longer taking pain medications. He has been attending PT 1-2 times per week as previously prescribed. He has been compliant with all post-op instructions to date. He presents today NWB with the use of a roll-a-bout scooter. He reports some mild numbness on the distal dorsal aspect of his foot. He has a reduction of ROM and strength at this point. He is here today for his regularly scheduled post-operative visit.    For other past medical history, social history, family history and past surgical history please see them listed below.    Club/School/Work Affiliation: None    Problem List: There is no problem list on file for this patient.      Past Medical History:    Past Medical History:   Diagnosis Date   . Hypertension        Social History:   Social History   Substance Use Topics   . Smoking status: Current Every Day Smoker   . Smokeless tobacco: Never Used   . Alcohol use Yes       Family History: History reviewed. No pertinent family history.    Past Surgical History:    Past Surgical History:   Procedure Laterality Date   . HERNIA REPAIR         Medications:  Scheduled Meds:  Continuous Infusions:  PRN Meds:.     Allergies:    Allergies   Allergen Reactions   . Vicodin [Hydrocodone-Acetaminophen]        ROS:  Constitutional: No fatigue, fever, weight loss, or weight gain.   Ears, Nose, Mouth & Throat: No sore throat or hearing loss.   Cardiovascular: No chest pain, blood clots, or leg cramps.   Respiratory: No shortness of breath, cough, or difficulty breathing.   Gastrointestinal: No nausea,  vomiting, diarrhea, or loss of appetite.   Genitourinary: No polyuria or kidney disease.   Musculoskeletal: No joint aches, muscle weakness or swelling of joints/body parts other than that mentioned above.  Integumentary: No finger nail changes or skin dryness.   Neurological: No numbness, burning discomfort, or headaches.   Psychiatric: No depression or anxiety.   Endocrine: No increased thirst, change in appetite or thyroid disease.   Hematologic/Lymphatic: No easy bruising or anemia.     EXAM: Right Ankle Exam:  Skin intact; well healed Achilles tendon surgical incision  Erythema: None present  Swelling: Mild  Effusion: None  Deformities: None    ROM: Reduced but WNL for this stage post-op  Active plantarflexion intact with calf contraction    Medial Malleolus TTP: None  Lateral Malleolus TTP: None  ATFL TTP: None  Deltoid Ligament TTP: None  Base of the 5th MT TTP: None  Achilles insertion TTP: None  Mid-Achilles tendon TTP: None    Other areas of Tenderness: None    Strength: Generalized weakness  Numbness over the distal dorsal foot     DTR and Pathological Reflexes Intact  No lymphadenopathy  Sensation Intact to light touch all distributions of leg  and foot  DF, PF, EHL motor intact  Foot Perfused with CR <2 sec  DP/PT pulse 2+    Vitals: BP 146/82   Pulse 98   Temp 98.2 F (36.8 C) (Oral)     General: Raymond Austin was alert, oriented, easily engaged, displayed logical thinking with clear speech and was neat in appearance. His general appearance was normal, well-developed and well-nourished.     Gait: The patient demonstrated non weightbearing gait with the use of a roll-a-bout with intact coordination and balance.    STUDIES: No new studies obtained on today's visit.     ASSESSMENT/PLAN: The patient will continue physical therapy and therapeutic exercises per protocol. Patient was instructed on the dos and don'ts regarding protection of the surgical procedure, as well as appropriate activity levels going  forward. Raymond Austin understands the importance of a dedicated therapy program. We have advised him of the following timeline: He should begin 50% partial weight bearing with the use of his crutches and slowly transition to FWBAT starting next week while in the boot and wedges. Then every 5-6 days he may begin to subtract one wedge layer from his boot until he is WBAT in the boot without wedges. The final step would be transitioning to a sneaker and gave the patient a rough timeline of approximately August 17th to accomplish this by. We will plan on follow up in 3-4 weeks around that timeframe. The patient will notify my clinic of any changes or worsening of their symptoms during the interim. All the patient's questions were answered appropriately and completely. Raymond Austin understands the plan.

## 2017-01-12 ENCOUNTER — Ambulatory Visit (INDEPENDENT_AMBULATORY_CARE_PROVIDER_SITE_OTHER): Payer: No Typology Code available for payment source | Admitting: Orthopaedic Surgery

## 2017-01-12 ENCOUNTER — Encounter (INDEPENDENT_AMBULATORY_CARE_PROVIDER_SITE_OTHER): Payer: Self-pay | Admitting: Orthopaedic Surgery

## 2017-01-12 VITALS — BP 144/90 | HR 92 | Temp 98.5°F

## 2017-01-12 DIAGNOSIS — Z9889 Other specified postprocedural states: Secondary | ICD-10-CM

## 2017-01-12 DIAGNOSIS — Z4789 Encounter for other orthopedic aftercare: Secondary | ICD-10-CM

## 2017-01-12 NOTE — Progress Notes (Signed)
[facility]    Provider: Hermelinda Dellen, MD  Date of Exam:  01/12/2017   Patient:  Raymond Austin  DOB:  20-Feb-1966    AGE:  51 y.o.  MR#:  [id number]     Diagnosis: s/p 11/13/2016 Right Achilles tendon repair    HPI:  Raymond Austin returns, 8 weeks status post right Achilles tendon repair as described above. He is doing well overall and denies any pain at this point post-op. He is no longer taking pain medications. He has been attending PT 1-2 times per week as previously prescribed. He has been compliant with all post-op instructions to date. He has been full WBAT in the boot.     For other past medical history, social history, family history and past surgical history please see them listed below.    Club/School/Work Affiliation: None    Problem List: There is no problem list on file for this patient.      Past Medical History:    Past Medical History:   Diagnosis Date   . Hypertension        Social History:   Social History   Substance Use Topics   . Smoking status: Current Every Day Smoker   . Smokeless tobacco: Never Used   . Alcohol use Yes       Family History: History reviewed. No pertinent family history.    Past Surgical History:    Past Surgical History:   Procedure Laterality Date   . HERNIA REPAIR         Medications:  Scheduled Meds:  Continuous Infusions:  PRN Meds:.     Allergies:    Allergies   Allergen Reactions   . Vicodin [Hydrocodone-Acetaminophen]        ROS:  Constitutional: No fatigue, fever, weight loss, or weight gain.   Ears, Nose, Mouth & Throat: No sore throat or hearing loss.   Cardiovascular: No chest pain, blood clots, or leg cramps.   Respiratory: No shortness of breath, cough, or difficulty breathing.   Gastrointestinal: No nausea, vomiting, diarrhea, or loss of appetite.   Genitourinary: No polyuria or kidney disease.   Musculoskeletal: No joint aches, muscle weakness or swelling of joints/body parts other than that mentioned  above.  Integumentary: No finger nail changes or skin dryness.   Neurological: No numbness, burning discomfort, or headaches.   Psychiatric: No depression or anxiety.   Endocrine: No increased thirst, change in appetite or thyroid disease.   Hematologic/Lymphatic: No easy bruising or anemia.     EXAM: Right Ankle Exam:  Skin intact; well healed Achilles tendon surgical incision  Erythema: None present  Swelling: Mild  Effusion: None  Deformities: None    ROM: Reduced but WNL for this stage post-op  Active plantarflexion intact with calf contraction lateral head more than medial head    Medial Malleolus TTP: None  Lateral Malleolus TTP: None  ATFL TTP: None  Deltoid Ligament TTP: None  Base of the 5th MT TTP: None  Achilles insertion TTP: None  Mid-Achilles tendon TTP: None    Other areas of Tenderness: None    Strength: Generalized weakness  Otherwise, motor and sensory intact.     DTR and Pathological Reflexes Intact  No lymphadenopathy  Sensation Intact to light touch all distributions of leg and foot  DF, PF, EHL motor intact  Foot Perfused with CR <2 sec  DP/PT pulse 2+    Vitals: BP 144/90   Pulse 92  Temp 98.5 F (36.9 C) (Oral)     General: Raymond Austin was alert, oriented, easily engaged, displayed logical thinking with clear speech and was neat in appearance. His general appearance was normal, well-developed and well-nourished.     Gait: The patient demonstrated non weightbearing gait with the use of a roll-a-bout with intact coordination and balance.    STUDIES: No new studies obtained on today's visit.     ASSESSMENT/PLAN: The patient will continue physical therapy and therapeutic exercises per protocol. Patient was instructed on the dos and don'ts regarding protection of the surgical procedure, as well as appropriate activity levels going forward. Raymond Austin understands the importance of a dedicated therapy program. He can transition out of the boot and into a tennis show at this time. I will see him  back in 6 weeks . The patient will notify my clinic of any changes or worsening of their symptoms during the interim. All the patient's questions were answered appropriately and completely. Raymond Austin understands the plan.

## 2017-01-30 ENCOUNTER — Encounter (INDEPENDENT_AMBULATORY_CARE_PROVIDER_SITE_OTHER): Payer: Self-pay | Admitting: Orthopaedic Surgery

## 2017-01-30 ENCOUNTER — Ambulatory Visit (INDEPENDENT_AMBULATORY_CARE_PROVIDER_SITE_OTHER): Payer: No Typology Code available for payment source | Admitting: Orthopaedic Surgery

## 2017-01-30 VITALS — HR 86 | Resp 16 | Ht 73.0 in | Wt 260.0 lb

## 2017-01-30 DIAGNOSIS — S86011D Strain of right Achilles tendon, subsequent encounter: Secondary | ICD-10-CM

## 2017-01-30 DIAGNOSIS — Z4789 Encounter for other orthopedic aftercare: Secondary | ICD-10-CM

## 2017-01-30 NOTE — Progress Notes (Signed)
[facility]             Provider: Hermelinda Dellen, MD  Date of Exam:  01/30/2017   Patient:  Raymond Austin  DOB:  08-22-1965    AGE:  51 y.o.  MR#:  [id number]     Chief Complaint: Right achilles pain.    HPI:  JOSEH SJOGREN is a pleasant 51 y.o. male who is now 2-1/2 months status post right Achilles tendon repair.  He has continued physical therapy is doing well.  He is here today.  His physical therapy was concerned about an area of redness just medial to the incision.  He notices that the redness returns after long periods of walking and standing.  It is tender to touch when it is raised.  He notices that rest resolve the problem.  He has no complaints of fever shows nausea.  Her numbness or tingling.  No new complaints.    For other past medical history, social history, family history and past surgical history please see them listed below.    Club/School Affiliation: NONE    Problem List: There is no problem list on file for this patient.      Past Medical History:    Past Medical History:   Diagnosis Date   . Hypertension        Social History:   Social History   Substance Use Topics   . Smoking status: Current Every Day Smoker   . Smokeless tobacco: Never Used   . Alcohol use Yes       Family History: History reviewed. No pertinent family history.    Past Surgical History:    Past Surgical History:   Procedure Laterality Date   . HERNIA REPAIR         Medications:  Scheduled Meds:  Continuous Infusions:  PRN Meds:.     Allergies:    Allergies   Allergen Reactions   . Vicodin [Hydrocodone-Acetaminophen]        ROS:   Constitutional: No fatigue, fever, weight loss, or weight gain.   Ears, Nose, Mouth & Throat: No sore throat or hearing loss.   Cardiovascular: No chest pain, blood clots, or leg cramps.   Respiratory: No shortness of breath, cough, or difficulty breathing.   Gastrointestinal: No nausea, vomiting, diarrhea, or loss of appetite.   Genitourinary: No  polyuria or kidney disease.   Musculoskeletal: No joint aches, muscle weakness or swelling of joints/body parts other than that mentioned above.  Integumentary: No finger nail changes or skin dryness.   Neurological: No numbness, burning discomfort, or headaches.   Psychiatric: No depression or anxiety.   Endocrine: No increased thirst, change in appetite or thyroid disease.   Hematologic/Lymphatic: No easy bruising or anemia.     EXAM:   Patient is alert and oriented.  Skin is clean, dry and intact, incisions are well-healed.  No redness, no errythema, no swelling  No ttp  0-5 degrees of dorsiflexion, 0-15 degrees plantarflexion  Calf is supple and nontender    Vitals: Pulse 86   Resp 16   Ht 1.854 m (6\' 1" )   Wt 117.9 kg (260 lb)   BMI 34.30 kg/m     General: Zohair was pleasant, oriented, easily engaged, displayed logical thinking with clear speech and was neat in appearance. His general appearance was normal, well-developed and well-nourished.     Gait: The patient demonstrated mildly antalgic gait with intact coordination and balance.  STUDIES: No imaging was done in the office today.    ASSESSMENT/PLAN: Theodus comes in today for postop follow-up and wound check.  He is doing very well with his progression physical therapy.  In regards to his wound, I see no areas of redness or irritation.  It is most likely soft tissue irritation from long periods of standing and walking.  Physical therapy.  He can continue his progression as tolerated.  He will continue to keep an eye on this area of redness and follow up as needed.  I will see him back in 8 weeks for reevaluation, normal postop follow-up.  All questions were answered.

## 2017-02-23 ENCOUNTER — Ambulatory Visit (INDEPENDENT_AMBULATORY_CARE_PROVIDER_SITE_OTHER): Payer: 59 | Admitting: Orthopaedic Surgery

## 2017-02-23 ENCOUNTER — Encounter (INDEPENDENT_AMBULATORY_CARE_PROVIDER_SITE_OTHER): Payer: Self-pay | Admitting: Orthopaedic Surgery

## 2017-02-23 VITALS — BP 161/94 | HR 89 | Temp 98.1°F

## 2017-02-23 DIAGNOSIS — Z4789 Encounter for other orthopedic aftercare: Secondary | ICD-10-CM

## 2017-02-23 DIAGNOSIS — G8929 Other chronic pain: Secondary | ICD-10-CM

## 2017-02-23 DIAGNOSIS — Z9889 Other specified postprocedural states: Secondary | ICD-10-CM

## 2017-02-23 DIAGNOSIS — M25562 Pain in left knee: Secondary | ICD-10-CM

## 2017-02-25 ENCOUNTER — Other Ambulatory Visit: Payer: Self-pay | Admitting: Gastroenterology

## 2017-02-25 DIAGNOSIS — R1013 Epigastric pain: Secondary | ICD-10-CM

## 2017-02-25 NOTE — Progress Notes (Signed)
[facility]             Provider: Hermelinda Dellen, MD  Date of Exam:  02/23/2017   Patient:  Raymond Austin  DOB:  13-Sep-1965    AGE:  51 y.o.  MR#:  [id number]     Chief Complaint: Right achilles repair 11/13/2016    HPI:  Raymond Austin is a pleasant 51 y.o. male who is now 3-1/2 months status post right Achilles tendon repair.  He has continued physical therapy is doing well.  He is here today. He has been doing well with PT.  He has no complaints of fever shows nausea.  Her numbness or tingling.  No new complaints.    For other past medical history, social history, family history and past surgical history please see them listed below.    Club/School Affiliation: NONE    Problem List: There is no problem list on file for this patient.      Past Medical History:    Past Medical History:   Diagnosis Date   . Hypertension        Social History:   Social History   Substance Use Topics   . Smoking status: Current Every Day Smoker   . Smokeless tobacco: Never Used   . Alcohol use Yes       Family History: History reviewed. No pertinent family history.    Past Surgical History:    Past Surgical History:   Procedure Laterality Date   . HERNIA REPAIR         Medications:  Scheduled Meds:  Continuous Infusions:  PRN Meds:.     Allergies:    Allergies   Allergen Reactions   . Vicodin [Hydrocodone-Acetaminophen]        ROS:   Constitutional: No fatigue, fever, weight loss, or weight gain.   Ears, Nose, Mouth & Throat: No sore throat or hearing loss.   Cardiovascular: No chest pain, blood clots, or leg cramps.   Respiratory: No shortness of breath, cough, or difficulty breathing.   Gastrointestinal: No nausea, vomiting, diarrhea, or loss of appetite.   Genitourinary: No polyuria or kidney disease.   Musculoskeletal: No joint aches, muscle weakness or swelling of joints/body parts other than that mentioned above.  Integumentary: No finger nail changes or skin dryness.   Neurological:  No numbness, burning discomfort, or headaches.   Psychiatric: No depression or anxiety.   Endocrine: No increased thirst, change in appetite or thyroid disease.   Hematologic/Lymphatic: No easy bruising or anemia.     EXAM:   Patient is alert and oriented.  Skin is clean, dry and intact, incisions are well-healed.  No redness, no errythema, no swelling  No ttp  0-5 degrees of dorsiflexion, 0-15 degrees plantarflexion  Calf is supple and nontender  Double stance heel raise good  Single is weak    Vitals: BP (!) 161/94   Pulse 89   Temp 98.1 F (36.7 C) (Oral)     General: Raymond Austin was pleasant, oriented, easily engaged, displayed logical thinking with clear speech and was neat in appearance. His general appearance was normal, well-developed and well-nourished.     Gait: The patient demonstrated mildly antalgic gait with intact coordination and balance.     STUDIES: No imaging was done in the office today.    ASSESSMENT/PLAN: Trent comes in today for postop follow-up.  He is doing very well with his progression physical therapy.  He will continue PT.  I will see him back in 6-8 weeks for reevaluation, normal postop follow-up.  All questions were answered.    More than 15 minutes was spent with the patient at today's visit with more than 50% of that time spent discussing the patient's diagnosis, alternative treatments, potential outcomes and recommended treatment plan.

## 2017-04-10 ENCOUNTER — Other Ambulatory Visit: Payer: Self-pay | Admitting: Physician Assistant

## 2017-04-10 DIAGNOSIS — R1011 Right upper quadrant pain: Secondary | ICD-10-CM

## 2017-04-15 ENCOUNTER — Ambulatory Visit: Payer: No Typology Code available for payment source | Attending: Orthopaedic Surgery

## 2017-04-15 DIAGNOSIS — G8929 Other chronic pain: Secondary | ICD-10-CM | POA: Insufficient documentation

## 2017-04-15 DIAGNOSIS — M25562 Pain in left knee: Secondary | ICD-10-CM | POA: Insufficient documentation

## 2017-04-20 ENCOUNTER — Ambulatory Visit (INDEPENDENT_AMBULATORY_CARE_PROVIDER_SITE_OTHER): Payer: No Typology Code available for payment source | Admitting: Orthopaedic Surgery

## 2017-04-20 ENCOUNTER — Encounter (INDEPENDENT_AMBULATORY_CARE_PROVIDER_SITE_OTHER): Payer: Self-pay | Admitting: Orthopaedic Surgery

## 2017-04-20 VITALS — HR 86 | Resp 15 | Ht 73.0 in | Wt 265.0 lb

## 2017-04-20 DIAGNOSIS — G8929 Other chronic pain: Secondary | ICD-10-CM

## 2017-04-20 DIAGNOSIS — S86011D Strain of right Achilles tendon, subsequent encounter: Secondary | ICD-10-CM

## 2017-04-20 DIAGNOSIS — M25562 Pain in left knee: Secondary | ICD-10-CM

## 2017-04-21 NOTE — Progress Notes (Signed)
[facility]             Provider: Hermelinda Dellen, MD  Date of Exam:  04/20/2017   Patient:  Raymond Austin  DOB:  [birth year]    AGE:  51 y.o.  MR#:  [id number]     Chief Complaint: Right achilles repair 11/13/2016    HPI:  Raymond Austin is a pleasant 50 y.o. male who is now 5 months status post right Achilles tendon repair.  He has continued physical therapy is doing well.  He is here today. He has been doing well with PT.  He has no complaints of fever shows nausea.  He states due to his overall weakness, he will have intermittent ankle rolling especially after walking and doing a lot. He has left knee pain as well for which we obtained xrays at the last visit. No change in the medial sided knee pain.     For other past medical history, social history, family history and past surgical history please see them listed below.    Club/School Affiliation: NONE    Problem List: There is no problem list on file for this patient.      Past Medical History:    Past Medical History:   Diagnosis Date   . Hypertension        Social History:   Social History   Substance Use Topics   . Smoking status: Current Every Day Smoker   . Smokeless tobacco: Never Used   . Alcohol use Yes       Family History: History reviewed. No pertinent family history.    Past Surgical History:    Past Surgical History:   Procedure Laterality Date   . HERNIA REPAIR         Medications:  Scheduled Meds:  Continuous Infusions:  PRN Meds:.     Allergies:    Allergies   Allergen Reactions   . Vicodin [Hydrocodone-Acetaminophen]        ROS:   Constitutional: No fatigue, fever, weight loss, or weight gain.   Ears, Nose, Mouth & Throat: No sore throat or hearing loss.   Cardiovascular: No chest pain, blood clots, or leg cramps.   Respiratory: No shortness of breath, cough, or difficulty breathing.   Gastrointestinal: No nausea, vomiting, diarrhea, or loss of appetite.   Genitourinary: No polyuria or kidney  disease.   Musculoskeletal: No joint aches, muscle weakness or swelling of joints/body parts other than that mentioned above.  Integumentary: No finger nail changes or skin dryness.   Neurological: No numbness, burning discomfort, or headaches.   Psychiatric: No depression or anxiety.   Endocrine: No increased thirst, change in appetite or thyroid disease.   Hematologic/Lymphatic: No easy bruising or anemia.     EXAM:   Patient is alert and oriented.  Skin is clean, dry and intact, incisions are well-healed.  Right Ankle:  No redness, no errythema, no swelling  No ttp  15 degrees of dorsiflexion, 20 degrees plantarflexion  Calf is supple and nontender  Double stance heel raise good  Single is weak    Left Knee Exam:  Skin intact  Erythema: None present  Swelling: None present  Effusion: None  Deformities: None  ROM: 0-135  Patellofemoral Crepitus: Negative  Patellar Apprehension at 30 deg: Negative  J Sign: Negative  Lachman 1A  Anterior Drawer: WNL  Posterior drawer: WNL  Dial Test at 30 WNL  at 90 WNL  Varus at 0  WNL at 30 WNL  Valgus at 0 WNL at 30 WNL  Medial McMurray's: positive  Lateral McMurray's: Negative  Medial Joint Line TTP: positive  Lateral Joint Line TTP: None  Strength: WNL    Other areas of Tenderness: None  DTR and Pathological Reflexes Intact  No lymphadenopathy  Sensation Intact to light touch all distributions of leg and foot  DF, PF, EHL motor intact  Foot Perfused with CR <2 sec  DP/PT pulse 2+    Vitals: Pulse 86   Resp 15   Ht 1.854 m (6\' 1" )   Wt 120.2 kg (265 lb)   BMI 34.96 kg/m     General: Raymond Austin was pleasant, oriented, easily engaged, displayed logical thinking with clear speech and was neat in appearance. His general appearance was normal, well-developed and well-nourished.     Gait: The patient demonstrated mildly antalgic gait with intact coordination and balance.     STUDIES:   Left knee xrays:  There is no fracture or dislocation. No lucent or sclerotic lesion is  seen.  The joint spaces are preserved. There is early spur formation at  the superior aspect of the patella at the patellofemoral joint. Soft  tissues are within normal limits. There is no suprapatellar effusion.    ASSESSMENT/PLAN: Raymond Austin comes in today for postop follow-up.  He is doing very well with his progression physical therapy but continues to be weak.  He will continue PT and strengthen.   I will see him back in 6-8 weeks for reevaluation, normal postop follow-up. With respect to the left knee, I think he has a medial meniscus tear which we will continue to follow conservatively at this time.  All questions were answered.    More than 15 minutes was spent with the patient at today's visit with more than 50% of that time spent discussing the patient's diagnosis, alternative treatments, potential outcomes and recommended treatment plan.

## 2017-05-12 ENCOUNTER — Ambulatory Visit
Admission: RE | Admit: 2017-05-12 | Discharge: 2017-05-12 | Disposition: A | Discharge status: Home / Self Care | Payer: BLUE CROSS/BLUE SHIELD | Source: Ambulatory Visit | Attending: Physician Assistant | Admitting: Physician Assistant

## 2017-05-12 DIAGNOSIS — R1011 Right upper quadrant pain: Secondary | ICD-10-CM

## 2017-05-27 ENCOUNTER — Encounter (INDEPENDENT_AMBULATORY_CARE_PROVIDER_SITE_OTHER): Payer: Self-pay | Admitting: Orthopaedic Surgery

## 2017-07-28 ENCOUNTER — Ambulatory Visit (INDEPENDENT_AMBULATORY_CARE_PROVIDER_SITE_OTHER): Payer: No Typology Code available for payment source | Admitting: Orthopaedic Surgery

## 2017-07-28 ENCOUNTER — Encounter (INDEPENDENT_AMBULATORY_CARE_PROVIDER_SITE_OTHER): Payer: Self-pay | Admitting: Orthopaedic Surgery

## 2017-07-28 VITALS — BP 135/80 | HR 70 | Ht 73.0 in | Wt 270.0 lb

## 2017-07-28 DIAGNOSIS — Z4789 Encounter for other orthopedic aftercare: Secondary | ICD-10-CM

## 2017-07-28 DIAGNOSIS — S83232A Complex tear of medial meniscus, current injury, left knee, initial encounter: Secondary | ICD-10-CM

## 2017-07-28 DIAGNOSIS — M25562 Pain in left knee: Secondary | ICD-10-CM

## 2017-07-28 DIAGNOSIS — S86011D Strain of right Achilles tendon, subsequent encounter: Secondary | ICD-10-CM

## 2017-07-28 NOTE — Progress Notes (Signed)
[facility]             Provider: Hermelinda Dellen, MD  Date of Exam:  07/28/2017   Patient:  Raymond Austin  DOB:  04-10-1966    AGE:  52 y.o.  MR#:  [id number]     Chief Complaint: Left knee pain, post-op right Achilles    Diagnosis: Left knee pain, likely MM tear, 11/13/2016 Right achilles repair     KNEE HPI:  Raymond Austin is a pleasant 52 y.o. male who is now in clinic to follow up on his previously diagnosed left knee pain with likely MM tear.  He has a history of left knee pain for the last 9 months without any specific MOI. He has attempted modification of activities, OTC NSAIDS, home exercises and physical therapy without complete relief of his symptoms.  He denies any change in the quality or location of their pain since our last visit. Raymond Austin is eager to discuss further treatment options and move forward accordingly.     ANKLE HPI:  Raymond Austin returns, 8 months status post right Achilles tendon repair as described above. He has been compliant with all post-operative instructions to date. He has been attending formal physical therapy as previously prescribed. He reports he is doing well overall but he is still lacking his full strength overall. Raymond Austin is here today for his regularly scheduled post-operative visit.    For other past medical history, social history, family history and past surgical history please see them listed below.    Club/School Affiliation: None    Problem List: There is no problem list on file for this patient.    Past Medical History:    Past Medical History:   Diagnosis Date   . Hypertension        Social History:   Social History   Substance Use Topics   . Smoking status: Current Every Day Smoker   . Smokeless tobacco: Never Used   . Alcohol use Yes       Family History: History reviewed. No pertinent family history.    Past Surgical History:    Past Surgical History:   Procedure Laterality Date   . HERNIA REPAIR          Medications:  Scheduled Meds:  Continuous Infusions:  PRN Meds:.     Allergies:    Allergies   Allergen Reactions   . Vicodin [Hydrocodone-Acetaminophen]        ROS:   Constitutional: No fatigue, fever, weight loss, or weight gain.   Ears, Nose, Mouth & Throat: No sore throat or hearing loss.   Cardiovascular: No chest pain, blood clots, or leg cramps.   Respiratory: No shortness of breath, cough, or difficulty breathing.   Gastrointestinal: No nausea, vomiting, diarrhea, or loss of appetite.   Genitourinary: No polyuria or kidney disease.   Musculoskeletal: No joint aches, muscle weakness or swelling of joints/body parts other than that mentioned above.  Integumentary: No finger nail changes or skin dryness.   Neurological: No numbness, burning discomfort, or headaches.   Psychiatric: No depression or anxiety.   Endocrine: No increased thirst, change in appetite or thyroid disease.   Hematologic/Lymphatic: No easy bruising or anemia.     EXAM: Right Ankle Exam:   Patient is alert and oriented.  Skin is clean, dry and intact, incisions are well-healed.  Right Ankle:  No redness, no errythema, no swelling  No ttp  15 degrees of dorsiflexion, 20 degrees  plantarflexion  Calf is supple and nontender  Double stance heel raise good  Single is weak    DTR and Pathological Reflexes Intact  No lymphadenopathy  Sensation Intact to light touch all distributions of leg and foot  DF, PF, EHL motor intact  Foot Perfused with CR <2 sec  DP/PT pulse 2+    Left Knee Exam:  Skin intact  Erythema: None present  Swelling: None present  Effusion: None  Deformities: None  ROM: 0-135  Patellofemoral Crepitus: Negative  Patellar Apprehension at 30 deg: Negative  J Sign: Negative  Lachman 1A  Anterior Drawer: WNL  Posterior drawer: WNL  Dial Test at 30 WNL  at 90 WNL  Varus at 0 WNL at 30 WNL  Valgus at 0 WNL at 30 WNL  Medial McMurray's: positive  Lateral McMurray's: Negative  Medial Joint Line TTP: positive  Lateral Joint Line TTP:  None  Strength: WNL    Other areas of Tenderness: None  DTR and Pathological Reflexes Intact  No lymphadenopathy  Sensation Intact to light touch all distributions of leg and foot  DF, PF, EHL motor intact  Foot Perfused with CR <2 sec  DP/PT pulse 2+    Vitals: BP 135/80   Pulse 70   Ht 1.854 m (6\' 1" )   Wt 122.5 kg (270 lb)   BMI 35.62 kg/m     General: Raymond Austin was oriented, easily engaged, displayed logical thinking with clear speech and was neat in appearance. His general appearance was normal, well-developed and well-nourished.    Gait: The patient demonstrated mildly antalgic gait with intact coordination and balance.     STUDIES: No new studies obtained on today's visit.     RIGHT ANKLE ASSESSMENT/PLAN: The patient will continue physical therapy and therapeutic exercises per protocol. Patient was instructed on the dos and don'ts regarding protection of the surgical procedure, as well as appropriate activity levels going forward. Raymond Austin understands the importance of a dedicated therapy program. We will plan on follow up in 6 weeks. The patient will notify my clinic of any changes or worsening of their symptoms during the interim. All the patient's questions were answered appropriately and completely. Raymond Austin understands the plan.    LEFT KNEE ASSESSMENT/PLAN: Raymond Austin is a pleasant 52 y.o. male with historical and clinical evidence of left knee medial meniscus tear.  The patient's diagnosis and treatment options were discussed in detail at today's visit. I have recommended an MRI for further evaluation and diagnosis.The patient will follow up in our clinic for review of the MRI at the next earliest available.  I have asked the patient to avoid high impact activities and deep knee bent positions if possible.  We have discussed the potential need for surgical intervention. The patient will notify my clinic of any changes or worsening of their symptoms during the interim.    More than 25  minutes was spent with the patient at today's visit with more than 50% of that time spent discussing the patient's diagnosis, alternative treatments, potential outcomes and recommended treatment plan.    I, Hermelinda Dellen, MD, have personally performed the history, physical exam and medical decision making; and confirmed the accuracy of the information in the transcribed note.    Date: 07/31/2017 Time: 8:26 AM

## 2018-06-04 ENCOUNTER — Other Ambulatory Visit (HOSPITAL_COMMUNITY): Payer: Self-pay | Admitting: Physician Assistant

## 2018-06-04 ENCOUNTER — Other Ambulatory Visit: Payer: Self-pay | Admitting: Physician Assistant

## 2018-06-04 DIAGNOSIS — R1011 Right upper quadrant pain: Secondary | ICD-10-CM

## 2018-06-11 ENCOUNTER — Ambulatory Visit
Admission: RE | Admit: 2018-06-11 | Discharge: 2018-06-11 | Disposition: A | Discharge status: Home / Self Care | Payer: BLUE CROSS/BLUE SHIELD | Source: Ambulatory Visit | Attending: Physician Assistant | Admitting: Physician Assistant

## 2018-06-11 ENCOUNTER — Other Ambulatory Visit: Payer: Self-pay | Admitting: Physician Assistant

## 2018-06-11 DIAGNOSIS — R079 Chest pain, unspecified: Secondary | ICD-10-CM

## 2018-06-16 ENCOUNTER — Encounter (HOSPITAL_COMMUNITY)
Admission: RE | Admit: 2018-06-16 | Discharge: 2018-06-16 | Disposition: A | Discharge status: Home / Self Care | Payer: BLUE CROSS/BLUE SHIELD | Source: Ambulatory Visit | Attending: Physician Assistant | Admitting: Physician Assistant

## 2018-06-16 DIAGNOSIS — R1011 Right upper quadrant pain: Secondary | ICD-10-CM | POA: Diagnosis not present

## 2018-06-16 MED ORDER — TECHNETIUM TC 99M MEBROFENIN IV KIT
5.0000 | PACK | Freq: Once | INTRAVENOUS | Status: AC | PRN
  Administered 2018-06-16: 5 via INTRAVENOUS

## 2018-07-05 ENCOUNTER — Other Ambulatory Visit: Payer: Self-pay | Admitting: Surgery

## 2018-07-14 NOTE — Progress Notes (Signed)
06-11-18 (Epic) CXR

## 2018-07-14 NOTE — Patient Instructions (Addendum)
Anthony Wilson  07/14/2018   Your procedure is scheduled on: 07-16-18    Report to [facility] Main  Entrance    Report to Short Stay at 5:30 AM    Call this number if you have problems the morning of surgery [phone]    Remember: NO SOLID FOOD AFTER MIDNIGHT THE NIGHT PRIOR TO SURGERY. NOTHING BY MOUTH EXCEPT CLEAR LIQUIDS UNTIL 3 HOURS PRIOR TO SCHEULED SURGERY. PLEASE FINISH ENSURE DRINK PER SURGEON ORDER 3 HOURS PRIOR TO SCHEDULED SURGERY TIME WHICH NEEDS TO BE COMPLETED AT 4:30 AM.     CLEAR LIQUID DIET   Foods Allowed                                                                     Foods Excluded  Coffee and tea, regular and decaf                             liquids that you cannot  Plain Jell-O in any flavor                                             see through such as: Fruit ices (not with fruit pulp)                                     milk, soups, orange juice  Iced Popsicles                                    All solid food Carbonated beverages, regular and diet                                    Cranberry, grape and apple juices Sports drinks like Gatorade Lightly seasoned clear broth or consume(fat free) Sugar, honey syrup  Sample Menu Breakfast                                Lunch                                     Supper Cranberry juice                    Beef broth                            Chicken broth Jell-O                                     Grape juice  Apple juice Coffee or tea                        Jell-O                                      Popsicle                                                Coffee or tea                        Coffee or tea  _____________________________________________________________________    Take these medicines the morning of surgery with A SIP OF WATER: None   BRUSH YOUR TEETH MORNING OF SURGERY AND RINSE YOUR MOUTH OUT, NO CHEWING GUM CANDY OR  MINTS.                                  You may not have any metal on your body including hair pins and              piercings  Do not wear jewelry, cologne, lotions, powders or deodorant             Men may shave face and neck.   Do not bring valuables to the hospital. [facility] IS NOT             RESPONSIBLE   FOR VALUABLES.  Contacts, dentures or bridgework may not be worn into surgery.      Patients discharged the day of surgery will not be allowed to drive home. IF YOU ARE HAVING SURGERY AND GOING HOME THE SAME DAY, YOU MUST HAVE AN ADULT TO DRIVE YOU HOME AND BE WITH YOU FOR 24 HOURS. YOU MAY GO HOME BY TAXI OR UBER OR ORTHERWISE, BUT AN ADULT MUST ACCOMPANY YOU HOME AND STAY WITH YOU FOR 24 HOURS.    Name and phone number of your driver: Anthony Wilson [phone]  Special Instructions: N/A              Please read over the following fact sheets you were given: _____________________________________________________________________             [facility] - Preparing for Surgery Before surgery, you can play an important role.  Because skin is not sterile, your skin needs to be as free of germs as possible.  You can reduce the number of germs on your skin by washing with CHG (chlorahexidine gluconate) soap before surgery.  CHG is an antiseptic cleaner which kills germs and bonds with the skin to continue killing germs even after washing. Please DO NOT use if you have an allergy to CHG or antibacterial soaps.  If your skin becomes reddened/irritated stop using the CHG and inform your nurse when you arrive at Short Stay. Do not shave (including legs and underarms) for at least 48 hours prior to the first CHG shower.  You may shave your face/neck. Please follow these instructions carefully:  1.  Shower with CHG Soap the night before surgery and the  morning of Surgery.  2.  If you choose to wash your hair, wash your hair first as usual  with your  normal  shampoo.  3.  After you  shampoo, rinse your hair and body thoroughly to remove the  shampoo.                           4.  Use CHG as you would any other liquid soap.  You can apply chg directly  to the skin and wash                       Gently with a scrungie or clean washcloth.  5.  Apply the CHG Soap to your body ONLY FROM THE NECK DOWN.   Do not use on face/ open                           Wound or open sores. Avoid contact with eyes, ears mouth and genitals (private parts).                       Wash face,  Genitals (private parts) with your normal soap.             6.  Wash thoroughly, paying special attention to the area where your surgery  will be performed.  7.  Thoroughly rinse your body with warm water from the neck down.  8.  DO NOT shower/wash with your normal soap after using and rinsing off  the CHG Soap.                9.  Pat yourself dry with a clean towel.            10.  Wear clean pajamas.            11.  Place clean sheets on your bed the night of your first shower and do not  sleep with pets. Day of Surgery : Do not apply any lotions/deodorants the morning of surgery.  Please wear clean clothes to the hospital/surgery center.  FAILURE TO FOLLOW THESE INSTRUCTIONS MAY RESULT IN THE CANCELLATION OF YOUR SURGERY PATIENT SIGNATURE_________________________________  NURSE SIGNATURE__________________________________  ________________________________________________________________________

## 2018-07-15 ENCOUNTER — Encounter (HOSPITAL_COMMUNITY)
Admission: RE | Admit: 2018-07-15 | Discharge: 2018-07-15 | Disposition: A | Discharge status: Home / Self Care | Payer: BLUE CROSS/BLUE SHIELD | Source: Ambulatory Visit | Attending: Surgery | Admitting: Surgery

## 2018-07-15 ENCOUNTER — Other Ambulatory Visit: Payer: Self-pay

## 2018-07-15 ENCOUNTER — Encounter (HOSPITAL_COMMUNITY): Payer: Self-pay

## 2018-07-15 DIAGNOSIS — Z01812 Encounter for preprocedural laboratory examination: Secondary | ICD-10-CM | POA: Diagnosis present

## 2018-07-15 HISTORY — DX: Gastro-esophageal reflux disease without esophagitis: K21.9

## 2018-07-15 LAB — BASIC METABOLIC PANEL
Anion gap: 7 (ref 5–15)
BUN: 16 mg/dL (ref 6–20)
CALCIUM: 9.3 mg/dL (ref 8.9–10.3)
CO2: 28 mmol/L (ref 22–32)
Chloride: 104 mmol/L (ref 98–111)
Creatinine, Ser: 0.87 mg/dL (ref 0.61–1.24)
GFR calc Af Amer: >60 mL/min (ref >60)
Glucose, Bld: 87 mg/dL (ref 70–99)
Potassium: 4.3 mmol/L (ref 3.5–5.1)
Sodium: 139 mmol/L (ref 135–145)

## 2018-07-15 LAB — CBC
HCT: 49.7 % (ref 39.0–52.0)
Hemoglobin: 15.5 g/dL (ref 13.0–17.0)
MCH: 27.3 pg (ref 26.0–34.0)
MCHC: 31.2 g/dL (ref 30.0–36.0)
MCV: 87.7 fL (ref 80.0–100.0)
Platelets: 330 10*3/uL (ref 150–400)
RBC: 5.67 MIL/uL (ref 4.22–5.81)
RDW: 12.5 % (ref 11.5–15.5)
WBC: 6.7 10*3/uL (ref 4.0–10.5)
nRBC: 0 % (ref 0.0–0.2)

## 2018-07-15 NOTE — H&P (Signed)
Anthony Wilson Documented: 07/05/2018 2:19 PM Location: [facility] Patient #: [id number] DOB: [birth year] Undefined / Language: [place] / Race: White Male   History of Present Illness Anthony Wilson [name] MD; 07/05/2018 2:39 PM) The patient is a 53 year old male who presents with abdominal pain. This is a pleasant 53 year old gentleman referred by Anthony Amy PA for abdominal pain. He is an extensive history of abdominal pain with pain in her leg up into his chest. He has known celiac disease. He had multiple ultrasounds showing no evidence of gallstones. He is even had a cardiac workup because of the pain. He now has pain and nausea with pain hurting due to his back after all fatty meals. This occurs now daily. He recently had a HIDA scan showing a 10% gallbladder ejection fraction. He has no cardiopulmonary history.   Past Surgical History Anthony Wilson, CMA; 07/05/2018 2:19 PM) Shoulder Surgery  Right.  Diagnostic Studies History Anthony Wilson, CMA; 07/05/2018 2:19 PM) Colonoscopy  never  Medication History Anthony Wilson, CMA; 07/05/2018 2:20 PM) Dexilant (60MG  Capsule DR, Oral) Active. Pantoprazole Sodium (40MG  Tablet DR, Oral) Active. Medications Reconciled  Social History Anthony Wilson, CMA; 07/05/2018 2:19 PM) Caffeine use  Carbonated beverages, Coffee, Tea. Illicit drug use  Remotely quit drug use. Tobacco use  Former smoker.  Family History Anthony Wilson, CMA; 07/05/2018 2:19 PM) Breast Cancer  Mother. Melanoma  Father.  Other Problems Anthony Wilson, CMA; 07/05/2018 2:19 PM) Back Pain  Chest pain  Gastroesophageal Reflux Disease  Other disease, cancer, significant illness  Umbilical Hernia Repair     Review of Systems (Anthony Wilson CMA; 07/05/2018 2:19 PM) General Present- Fatigue. Not Present- Appetite Loss, Chills, Fever, Night Sweats, Weight Gain and Weight Loss. Skin Not Present- Change in Wart/Mole, Dryness, Hives,  Jaundice, New Lesions, Non-Healing Wounds, Rash and Ulcer. HEENT Present- Wears glasses/contact lenses. Not Present- Earache, Hearing Loss, Hoarseness, Nose Bleed, Oral Ulcers, Ringing in the Ears, Seasonal Allergies, Sinus Pain, Sore Throat, Visual Disturbances and Yellow Eyes. Respiratory Not Present- Bloody sputum, Chronic Cough, Difficulty Breathing, Snoring and Wheezing. Breast Not Present- Breast Mass, Breast Pain, Nipple Discharge and Skin Changes. Cardiovascular Not Present- Chest Pain, Difficulty Breathing Lying Down, Leg Cramps, Palpitations, Rapid Heart Rate, Shortness of Breath and Swelling of Extremities. Gastrointestinal Present- Abdominal Pain, Bloating, Indigestion and Nausea. Not Present- Bloody Stool, Change in Bowel Habits, Chronic diarrhea, Constipation, Difficulty Swallowing, Excessive gas, Gets full quickly at meals, Hemorrhoids, Rectal Pain and Vomiting. Male Genitourinary Not Present- Blood in Urine, Change in Urinary Stream, Frequency, Impotence, Nocturia, Painful Urination, Urgency and Urine Leakage. Musculoskeletal Not Present- Back Pain, Joint Pain, Joint Stiffness, Muscle Pain, Muscle Weakness and Swelling of Extremities. Neurological Not Present- Decreased Memory, Fainting, Headaches, Numbness, Seizures, Tingling, Tremor, Trouble walking and Weakness. Psychiatric Not Present- Anxiety, Bipolar, Change in Sleep Pattern, Depression, Fearful and Frequent crying. Endocrine Not Present- Cold Intolerance, Excessive Hunger, Hair Changes, Heat Intolerance, Hot flashes and New Diabetes. Hematology Not Present- Blood Thinners, Easy Bruising, Excessive bleeding, Gland problems, HIV and Persistent Infections.  Vitals (Anthony Wilson CMA; 07/05/2018 2:20 PM) 07/05/2018 2:20 PM Weight: 194 lb Height: 66in Body Surface Area: 1.97 m Body Mass Index: 31.31 kg/m  BP: 140/90 (Sitting, Left Arm, Standard)       Physical Exam (Anthony Wilson [name] MD; 07/05/2018 2:41  PM) General Mental Status-Alert. General Appearance-Consistent with stated age. Hydration-Well hydrated. Voice-Normal.  Head and Neck Head-normocephalic, atraumatic with no lesions or palpable masses.  Eye Eyeball - Bilateral-Extraocular movements  intact. Sclera/Conjunctiva - Bilateral-No scleral icterus.  Chest and Lung Exam Chest and lung exam reveals -quiet, even and easy respiratory effort with no use of accessory muscles and on auscultation, normal breath sounds, no adventitious sounds and normal vocal resonance. Inspection Chest Wall - Normal. Back - normal.  Cardiovascular Cardiovascular examination reveals -on palpation PMI is normal in location and amplitude, no palpable S3 or S4. Normal cardiac borders., normal heart sounds, regular rate and rhythm with no murmurs, carotid auscultation reveals no bruits and normal pedal pulses bilaterally.  Abdomen Inspection Inspection of the abdomen reveals - No Hernias. Skin - Scar - no surgical scars. Palpation/Percussion Palpation and Percussion of the abdomen reveal - Soft, No Rebound tenderness, No Rigidity (guarding) and No hepatosplenomegaly. Tenderness - Epigastrium and Right Upper Quadrant. Note: There is mild tenderness with minimal guarding in the epigastrium and right upper quadrant. Auscultation Auscultation of the abdomen reveals - Bowel sounds normal. Note: small umbilical hernia   Neurologic - Did not examine.  Musculoskeletal - Did not examine.    Assessment & Plan (Anthony Wilson 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 I discussed the diagnosis with the patient and his wife in detail. We discussed the nature of biliary dyskinesia. We discussed continued nonoperative management versus laparoscopic cholecystectomy. He is very eager to proceed with  surgery which fills very reasonable. Again, I do suspect he will have some chronic cholecystitis. I discussed the surgical procedure with him in detail and gave him literature. We discussed the risk which includes but is not limited to bleeding, infection, injury to surrounding structures, need to convert to an open procedure, the chances may not resolve his symptoms, cardiopulmonary issues, postoperative recovery, etc. He understands and wished to proceed with surgery which will be scheduled. He does have a small vocal hernia which we repaired primarily at the same time of surgery.

## 2018-07-15 NOTE — Anesthesia Preprocedure Evaluation (Addendum)
Anesthesia Evaluation  Patient identified by MRN, date of birth, ID band Patient awake    Reviewed: Allergy & Precautions, NPO status , Patient's Chart, lab work & pertinent test results  Airway Mallampati: II       Dental no notable dental hx. (+) Teeth Intact   Pulmonary former smoker,    Pulmonary exam normal breath sounds clear to auscultation       Cardiovascular negative cardio ROS Normal cardiovascular exam Rhythm:Regular Rate:Normal     Neuro/Psych negative neurological ROS  negative psych ROS   GI/Hepatic Neg liver ROS, GERD  ,  Endo/Other  negative endocrine ROS  Renal/GU negative Renal ROS  negative genitourinary   Musculoskeletal   Abdominal Normal abdominal exam  (+)   Peds  Hematology negative hematology ROS (+)   Anesthesia Other Findings   Reproductive/Obstetrics                            Anesthesia Physical Anesthesia Plan  ASA: II  Anesthesia Plan: General   Post-op Pain Management:    Induction: Intravenous  PONV Risk Score and Plan: 4 or greater and Ondansetron, Dexamethasone and Midazolam  Airway Management Planned: Oral ETT  Additional Equipment:   Intra-op Plan:   Post-operative Plan: Extubation in OR  Informed Consent: I have reviewed the patients History and Physical, chart, labs and discussed the procedure including the risks, benefits and alternatives for the proposed anesthesia with the patient or authorized representative who has indicated his/her understanding and acceptance.     Dental advisory given  Plan Discussed with: CRNA  Anesthesia Plan Comments:        Anesthesia Quick Evaluation

## 2018-07-16 ENCOUNTER — Encounter (HOSPITAL_COMMUNITY)
Admission: RE | Disposition: A | Discharge status: Home / Self Care | Payer: Self-pay | Source: Home / Self Care | Attending: Surgery

## 2018-07-16 ENCOUNTER — Ambulatory Visit (HOSPITAL_COMMUNITY)
Admission: RE | Admit: 2018-07-16 | Discharge: 2018-07-16 | Disposition: A | Discharge status: Home / Self Care | Payer: BLUE CROSS/BLUE SHIELD | Attending: Surgery | Admitting: Surgery

## 2018-07-16 ENCOUNTER — Ambulatory Visit (HOSPITAL_COMMUNITY): Payer: BLUE CROSS/BLUE SHIELD | Admitting: Physician Assistant

## 2018-07-16 ENCOUNTER — Ambulatory Visit (HOSPITAL_COMMUNITY): Payer: BLUE CROSS/BLUE SHIELD | Admitting: Anesthesiology

## 2018-07-16 ENCOUNTER — Encounter (HOSPITAL_COMMUNITY): Payer: Self-pay

## 2018-07-16 DIAGNOSIS — Z79899 Other long term (current) drug therapy: Secondary | ICD-10-CM | POA: Insufficient documentation

## 2018-07-16 DIAGNOSIS — K811 Chronic cholecystitis: Secondary | ICD-10-CM | POA: Diagnosis present

## 2018-07-16 DIAGNOSIS — Z87891 Personal history of nicotine dependence: Secondary | ICD-10-CM | POA: Diagnosis not present

## 2018-07-16 DIAGNOSIS — K828 Other specified diseases of gallbladder: Secondary | ICD-10-CM | POA: Insufficient documentation

## 2018-07-16 DIAGNOSIS — Z803 Family history of malignant neoplasm of breast: Secondary | ICD-10-CM | POA: Diagnosis not present

## 2018-07-16 DIAGNOSIS — K219 Gastro-esophageal reflux disease without esophagitis: Secondary | ICD-10-CM | POA: Insufficient documentation

## 2018-07-16 DIAGNOSIS — M549 Dorsalgia, unspecified: Secondary | ICD-10-CM | POA: Insufficient documentation

## 2018-07-16 DIAGNOSIS — Z808 Family history of malignant neoplasm of other organs or systems: Secondary | ICD-10-CM | POA: Insufficient documentation

## 2018-07-16 HISTORY — PX: CHOLECYSTECTOMY: SHX55

## 2018-07-16 SURGERY — LAPAROSCOPIC CHOLECYSTECTOMY
Anesthesia: General | Site: Abdomen

## 2018-07-16 MED ORDER — BUPIVACAINE HCL (PF) 0.25 % IJ SOLN
INTRAMUSCULAR | Status: AC
  Filled 2018-07-16: qty 30

## 2018-07-16 MED ORDER — IBUPROFEN 200 MG PO TABS: 200.0000 mg | ORAL_TABLET | Freq: Four times a day (QID) | ORAL | Status: DC | PRN

## 2018-07-16 MED ORDER — LIDOCAINE 2% (20 MG/ML) 5 ML SYRINGE
INTRAMUSCULAR | Status: DC | PRN
  Administered 2018-07-16: 100 mg via INTRAVENOUS

## 2018-07-16 MED ORDER — FENTANYL CITRATE (PF) 100 MCG/2ML IJ SOLN
INTRAMUSCULAR | Status: AC
  Filled 2018-07-16: qty 2

## 2018-07-16 MED ORDER — CHLORHEXIDINE GLUCONATE CLOTH 2 % EX PADS: 6.0000 | MEDICATED_PAD | Freq: Once | CUTANEOUS | Status: DC

## 2018-07-16 MED ORDER — BUPIVACAINE HCL (PF) 0.25 % IJ SOLN
INTRAMUSCULAR | Status: DC | PRN
  Administered 2018-07-16: 20 mL

## 2018-07-16 MED ORDER — PHENYLEPHRINE 40 MCG/ML (10ML) SYRINGE FOR IV PUSH (FOR BLOOD PRESSURE SUPPORT)
PREFILLED_SYRINGE | INTRAVENOUS | Status: AC
  Filled 2018-07-16: qty 10

## 2018-07-16 MED ORDER — PROPOFOL 10 MG/ML IV BOLUS
INTRAVENOUS | Status: AC
  Filled 2018-07-16: qty 20

## 2018-07-16 MED ORDER — OXYCODONE HCL 5 MG/5ML PO SOLN: 5.0000 mg | Freq: Once | ORAL | Status: AC | PRN

## 2018-07-16 MED ORDER — CEFAZOLIN SODIUM-DEXTROSE 2-4 GM/100ML-% IV SOLN
2.0000 g | INTRAVENOUS | Status: AC
  Administered 2018-07-16: 2 g via INTRAVENOUS
  Filled 2018-07-16: qty 100

## 2018-07-16 MED ORDER — LIDOCAINE 2% (20 MG/ML) 5 ML SYRINGE
INTRAMUSCULAR | Status: AC
  Filled 2018-07-16: qty 5

## 2018-07-16 MED ORDER — ROCURONIUM BROMIDE 50 MG/5ML IV SOSY
PREFILLED_SYRINGE | INTRAVENOUS | Status: DC | PRN
  Administered 2018-07-16: 40 mg via INTRAVENOUS

## 2018-07-16 MED ORDER — PROPOFOL 10 MG/ML IV BOLUS
INTRAVENOUS | Status: DC | PRN
  Administered 2018-07-16: 200 mg via INTRAVENOUS

## 2018-07-16 MED ORDER — KETOROLAC TROMETHAMINE 30 MG/ML IJ SOLN
30.0000 mg | Freq: Once | INTRAMUSCULAR | Status: AC | PRN
  Administered 2018-07-16: 30 mg via INTRAVENOUS

## 2018-07-16 MED ORDER — ACETAMINOPHEN 500 MG PO TABS
1000.0000 mg | ORAL_TABLET | ORAL | Status: AC
  Administered 2018-07-16: 1000 mg via ORAL
  Filled 2018-07-16: qty 2

## 2018-07-16 MED ORDER — IBUPROFEN 100 MG/5ML PO SUSP
200.0000 mg | Freq: Four times a day (QID) | ORAL | Status: DC | PRN
  Filled 2018-07-16: qty 20

## 2018-07-16 MED ORDER — OXYCODONE HCL 5 MG PO TABS: 5.0000 mg | ORAL_TABLET | Freq: Four times a day (QID) | ORAL | 0 refills | Status: DC | PRN

## 2018-07-16 MED ORDER — ONDANSETRON HCL 4 MG/2ML IJ SOLN: 4.0000 mg | Freq: Once | INTRAMUSCULAR | Status: DC | PRN

## 2018-07-16 MED ORDER — FENTANYL CITRATE (PF) 100 MCG/2ML IJ SOLN
INTRAMUSCULAR | Status: AC
  Administered 2018-07-16: 50 ug via INTRAVENOUS
  Filled 2018-07-16: qty 2

## 2018-07-16 MED ORDER — FENTANYL CITRATE (PF) 100 MCG/2ML IJ SOLN
25.0000 ug | INTRAMUSCULAR | Status: DC | PRN
  Administered 2018-07-16 (×2): 50 ug via INTRAVENOUS

## 2018-07-16 MED ORDER — MIDAZOLAM HCL 2 MG/2ML IJ SOLN
INTRAMUSCULAR | Status: AC
  Filled 2018-07-16: qty 2

## 2018-07-16 MED ORDER — MIDAZOLAM HCL 5 MG/5ML IJ SOLN
INTRAMUSCULAR | Status: DC | PRN
  Administered 2018-07-16: 2 mg via INTRAVENOUS

## 2018-07-16 MED ORDER — PHENYLEPHRINE 40 MCG/ML (10ML) SYRINGE FOR IV PUSH (FOR BLOOD PRESSURE SUPPORT)
PREFILLED_SYRINGE | INTRAVENOUS | Status: DC | PRN
  Administered 2018-07-16: 80 ug via INTRAVENOUS

## 2018-07-16 MED ORDER — RINGERS IRRIGATION IR SOLN
Status: DC | PRN
  Administered 2018-07-16: 1000 mL

## 2018-07-16 MED ORDER — DEXAMETHASONE SODIUM PHOSPHATE 4 MG/ML IJ SOLN
INTRAMUSCULAR | Status: DC | PRN
  Administered 2018-07-16: 10 mg via INTRAVENOUS

## 2018-07-16 MED ORDER — ONDANSETRON HCL 4 MG/2ML IJ SOLN
INTRAMUSCULAR | Status: DC | PRN
  Administered 2018-07-16: 4 mg via INTRAVENOUS

## 2018-07-16 MED ORDER — OXYCODONE HCL 5 MG PO TABS
ORAL_TABLET | ORAL | Status: AC
  Filled 2018-07-16: qty 1

## 2018-07-16 MED ORDER — SUGAMMADEX SODIUM 200 MG/2ML IV SOLN
INTRAVENOUS | Status: AC
  Filled 2018-07-16: qty 2

## 2018-07-16 MED ORDER — GABAPENTIN 300 MG PO CAPS
300.0000 mg | ORAL_CAPSULE | ORAL | Status: AC
  Administered 2018-07-16: 300 mg via ORAL
  Filled 2018-07-16: qty 1

## 2018-07-16 MED ORDER — LACTATED RINGERS IV SOLN
INTRAVENOUS | Status: DC
  Administered 2018-07-16: 06:00:00 via INTRAVENOUS

## 2018-07-16 MED ORDER — DEXAMETHASONE SODIUM PHOSPHATE 10 MG/ML IJ SOLN
INTRAMUSCULAR | Status: AC
  Filled 2018-07-16: qty 1

## 2018-07-16 MED ORDER — CELECOXIB 200 MG PO CAPS
200.0000 mg | ORAL_CAPSULE | ORAL | Status: AC
  Administered 2018-07-16: 200 mg via ORAL
  Filled 2018-07-16: qty 1

## 2018-07-16 MED ORDER — ONDANSETRON HCL 4 MG/2ML IJ SOLN
INTRAMUSCULAR | Status: AC
  Filled 2018-07-16: qty 2

## 2018-07-16 MED ORDER — FENTANYL CITRATE (PF) 100 MCG/2ML IJ SOLN
INTRAMUSCULAR | Status: DC | PRN
  Administered 2018-07-16 (×6): 50 ug via INTRAVENOUS

## 2018-07-16 MED ORDER — MEPERIDINE HCL 50 MG/ML IJ SOLN: 6.2500 mg | INTRAMUSCULAR | Status: DC | PRN

## 2018-07-16 MED ORDER — OXYCODONE HCL 5 MG PO TABS
5.0000 mg | ORAL_TABLET | Freq: Once | ORAL | Status: AC | PRN
  Administered 2018-07-16: 5 mg via ORAL

## 2018-07-16 MED ORDER — SUGAMMADEX SODIUM 200 MG/2ML IV SOLN
INTRAVENOUS | Status: DC | PRN
  Administered 2018-07-16 (×2): 200 mg via INTRAVENOUS

## 2018-07-16 MED ORDER — KETOROLAC TROMETHAMINE 30 MG/ML IJ SOLN
INTRAMUSCULAR | Status: AC
  Administered 2018-07-16: 30 mg via INTRAVENOUS
  Filled 2018-07-16: qty 1

## 2018-07-16 SURGICAL SUPPLY — 30 items
ADH SKN CLS APL DERMABOND .7 (GAUZE/BANDAGES/DRESSINGS) ×1
APPLIER CLIP 5 13 M/L LIGAMAX5 (MISCELLANEOUS) ×3
APR CLP MED LRG 5 ANG JAW (MISCELLANEOUS) ×1
BAG SPEC RTRVL LRG 6X4 10 (ENDOMECHANICALS) ×1
CABLE HIGH FREQUENCY MONO STRZ (ELECTRODE) ×3 IMPLANT
CHLORAPREP W/TINT 26ML (MISCELLANEOUS) ×3 IMPLANT
CLIP APPLIE 5 13 M/L LIGAMAX5 (MISCELLANEOUS) ×1 IMPLANT
COVER MAYO STAND STRL (DRAPES) IMPLANT
COVER WAND RF STERILE (DRAPES) IMPLANT
DECANTER SPIKE VIAL GLASS SM (MISCELLANEOUS) ×3 IMPLANT
DERMABOND ADVANCED (GAUZE/BANDAGES/DRESSINGS) ×2
DERMABOND ADVANCED .7 DNX12 (GAUZE/BANDAGES/DRESSINGS) ×1 IMPLANT
DRAPE C-ARM 42X120 X-RAY (DRAPES) IMPLANT
ELECT REM PT RETURN 15FT ADLT (MISCELLANEOUS) ×3 IMPLANT
GLOVE SURG SIGNA 7.5 PF LTX (GLOVE) ×3 IMPLANT
GOWN STRL REUS W/TWL XL LVL3 (GOWN DISPOSABLE) ×6 IMPLANT
HEMOSTAT SURGICEL 4X8 (HEMOSTASIS) IMPLANT
KIT BASIN OR (CUSTOM PROCEDURE TRAY) ×3 IMPLANT
POUCH SPECIMEN RETRIEVAL 10MM (ENDOMECHANICALS) ×3 IMPLANT
SCISSORS LAP 5X35 DISP (ENDOMECHANICALS) ×3 IMPLANT
SET CHOLANGIOGRAPH MIX (MISCELLANEOUS) IMPLANT
SET IRRIG TUBING LAPAROSCOPIC (IRRIGATION / IRRIGATOR) ×3 IMPLANT
SET TUBE SMOKE EVAC HIGH FLOW (TUBING) ×3 IMPLANT
SLEEVE XCEL OPT CAN 5 100 (ENDOMECHANICALS) ×6 IMPLANT
SUT MNCRL AB 4-0 PS2 18 (SUTURE) ×3 IMPLANT
TOWEL OR 17X26 10 PK STRL BLUE (TOWEL DISPOSABLE) ×3 IMPLANT
TOWEL OR NON WOVEN STRL DISP B (DISPOSABLE) ×3 IMPLANT
TRAY LAPAROSCOPIC (CUSTOM PROCEDURE TRAY) ×3 IMPLANT
TROCAR BLADELESS OPT 5 100 (ENDOMECHANICALS) ×3 IMPLANT
TROCAR XCEL BLUNT TIP 100MML (ENDOMECHANICALS) ×3 IMPLANT

## 2018-07-16 NOTE — Transfer of Care (Signed)
Immediate Anesthesia Transfer of Care Note  Patient: Anthony Wilson  Procedure(s) Performed: Procedure(s): LAPAROSCOPIC CHOLECYSTECTOMY ERAS PATHWAY, UMBILICAL HERNIA REPAIR (N/A)  Patient Location: PACU  Anesthesia Type:General  Level of Consciousness: Patient easily awoken, sedated, comfortable, cooperative, following commands, responds to stimulation.   Airway & Oxygen Therapy: Patient spontaneously breathing, ventilating well, oxygen via simple oxygen mask.  Post-op Assessment: Report given to PACU RN, vital signs reviewed and stable, moving all extremities.   Post vital signs: Reviewed and stable.  Complications: No apparent anesthesia complications Last Vitals:  Vitals Value Taken Time  BP    Temp    Pulse 95 07/16/2018  8:19 AM  Resp 14 07/16/2018  8:19 AM  SpO2 98 % 07/16/2018  8:19 AM  Vitals shown include unvalidated device data.  Last Pain:  Vitals:   07/16/18 0616  TempSrc:   PainSc: 1       Patients Stated Pain Goal: 0 (07/16/18 4098)  Complications: No apparent anesthesia complications

## 2018-07-16 NOTE — Op Note (Addendum)
Laparoscopic Cholecystectomy Procedure Note  Indications: This patient presents with symptomatic gallbladder disease and will undergo laparoscopic cholecystectomy.  Pre-operative Diagnosis: biliary dyskinesia  Post-operative Diagnosis: Same  Surgeon: Abigail Miyamoto   Assistants: 0  Anesthesia: General endotracheal anesthesia  ASA Class: 2  Procedure Details  The patient was seen again in the Holding Room. The risks, benefits, complications, treatment options, and expected outcomes were discussed with the patient. The possibilities of reaction to medication, pulmonary aspiration, perforation of viscus, bleeding, recurrent infection, finding a normal gallbladder, the need for additional procedures, failure to diagnose a condition, the possible need to convert to an open procedure, and creating a complication requiring transfusion or operation were discussed with the patient. The likelihood of improving the patient's symptoms with return to their baseline status is good.  The patient and/or family concurred with the proposed plan, giving informed consent. The site of surgery properly noted. The patient was taken to Operating Room, identified as Anthony Wilson and the procedure verified as Laparoscopic Cholecystectomy with Intraoperative Cholangiogram. A Time Out was held and the above information confirmed.  Prior to the induction of general anesthesia, antibiotic prophylaxis was administered. General endotracheal anesthesia was then administered and tolerated well. After the induction, the abdomen was prepped with Chloraprep and draped in sterile fashion. The patient was positioned in the supine position.  Local anesthetic agent was injected into the skin near the umbilicus and an incision made. We dissected down to the abdominal fascia with blunt dissection.  The fascia was incised vertically and we entered the peritoneal cavity bluntly through a small umbilical hernia.  A pursestring  suture of 0-Vicryl was placed around the fascial opening.  The Hasson cannula was inserted and secured with the stay suture.  Pneumoperitoneum was then created with CO2 and tolerated well without any adverse changes in the patient's vital signs. A  5-mm port was placed in the subxiphoid position.  Two 5-mm ports were placed in the right upper quadrant. All skin incisions were infiltrated with a local anesthetic agent before making the incision and placing the trocars.   We positioned the patient in reverse Trendelenburg, tilted slightly to the patient's left.  The gallbladder was identified, the fundus grasped and retracted cephalad. Adhesions were lysed bluntly and with the electrocautery where indicated, taking care not to injure any adjacent organs or viscus. The infundibulum was grasped and retracted laterally, exposing the peritoneum overlying the triangle of Calot. This was then divided and exposed in a blunt fashion. The cystic duct was clearly identified and bluntly dissected circumferentially. A critical view of the cystic duct and cystic artery was obtained.  The cystic duct was then ligated with clips and divided. The cystic artery was, dissected free, ligated with clips and divided as well.   The gallbladder was dissected from the liver bed in retrograde fashion with the electrocautery. The gallbladder was removed and placed in an Endocatch sac. The liver bed was irrigated and inspected. Hemostasis was achieved with the electrocautery. Copious irrigation was utilized and was repeatedly aspirated until clear.  The gallbladder and Endocatch sac were then removed through the umbilical port site.  The pursestring suture was used to close the umbilical fascia.  I placed another figure of 8 0 Vicryl suture at the closure site as well given the hernia.  We again inspected the right upper quadrant for hemostasis.  Pneumoperitoneum was released as we removed the trocars.  4-0 Monocryl was used to close the  skin.   Skin glue  was then applied. The patient was then extubated and brought to the recovery room in stable condition. Instrument, sponge, and needle counts were correct at closure and at the conclusion of the case.   Findings: Chronic Cholecystitis without Cholelithiasis  Estimated Blood Loss: Minimal         Drains: 0         Specimens: Gallbladder           Complications: None; patient tolerated the procedure well.         Disposition: PACU - hemodynamically stable.         Condition: stable

## 2018-07-16 NOTE — Anesthesia Procedure Notes (Signed)
Procedure Name: Intubation Date/Time: 07/16/2018 7:30 AM Performed by: Deliah Boston, CRNA Pre-anesthesia Checklist: Patient identified, Emergency Drugs available, Suction available and Patient being monitored Patient Re-evaluated:Patient Re-evaluated prior to induction Oxygen Delivery Method: Circle system utilized Preoxygenation: Pre-oxygenation with 100% oxygen Induction Type: IV induction Ventilation: Mask ventilation without difficulty Laryngoscope Size: Mac and 4 Grade View: Grade II Tube type: Oral Tube size: 7.5 mm Number of attempts: 1 Airway Equipment and Method: Stylet and Oral airway Placement Confirmation: ETT inserted through vocal cords under direct vision,  positive ETCO2 and breath sounds checked- equal and bilateral Secured at: 21 cm Tube secured with: Tape Dental Injury: Teeth and Oropharynx as per pre-operative assessment and Injury to lip  Difficulty Due To: Difficulty was unanticipated and Difficult Airway- due to limited oral opening Comments: Small nick to upper left lip

## 2018-07-16 NOTE — Anesthesia Postprocedure Evaluation (Signed)
Anesthesia Post Note  Patient: Anthony Wilson  Procedure(s) Performed: LAPAROSCOPIC CHOLECYSTECTOMY ERAS PATHWAY, UMBILICAL HERNIA REPAIR (N/A Abdomen)     Patient location during evaluation: PACU Anesthesia Type: General Level of consciousness: awake Pain management: pain level controlled Vital Signs Assessment: post-procedure vital signs reviewed and stable Respiratory status: spontaneous breathing Cardiovascular status: stable Postop Assessment: no apparent nausea or vomiting Anesthetic complications: no    Last Vitals:  Vitals:   07/16/18 0819 07/16/18 0830  BP: (!) 147/81 132/76  Pulse: 96 84  Resp: 16 (!) 7  Temp: 36.9 C   SpO2: 95% 98%    Last Pain:  Vitals:   07/16/18 0845  TempSrc:   PainSc: 5    Pain Goal: Patients Stated Pain Goal: 0 (07/16/18 0616)                 [name]

## 2018-07-16 NOTE — Discharge Instructions (Signed)
[facility] ______[facility] LAPAROSCOPIC SURGERY: POST OP INSTRUCTIONS Always review your discharge instruction sheet given to you by the facility where your surgery was performed. IF YOU HAVE DISABILITY OR FAMILY LEAVE FORMS, YOU MUST BRING THEM TO THE OFFICE FOR PROCESSING.   DO NOT GIVE THEM TO YOUR DOCTOR.  1. A prescription for pain medication may be given to you upon discharge.  Take your pain medication as prescribed, if needed.  If narcotic pain medicine is not needed, then you may take acetaminophen (Tylenol) or ibuprofen (Advil) as needed. 2. Take your usually prescribed medications unless otherwise directed. 3. If you need a refill on your pain medication, please contact your pharmacy.  They will contact our office to request authorization. Prescriptions will not be filled after 5pm or on week-ends. 4. You should follow a light diet the first few days after arrival home, such as soup and crackers, etc.  Be sure to include lots of fluids daily. 5. Most patients will experience some swelling and bruising in the area of the incisions.  Ice packs will help.  Swelling and bruising can take several days to resolve.  6. It is common to experience some constipation if taking pain medication after surgery.  Increasing fluid intake and taking a stool softener (such as Colace) will usually help or prevent this problem from occurring.  A mild laxative (Milk of Magnesia or Miralax) should be taken according to package instructions if there are no bowel movements after 48 hours. 7. Unless discharge instructions indicate otherwise, you may remove your bandages 24-48 hours after surgery, and you may shower at that time.  You may have steri-strips (small skin tapes) in place directly over the incision.  These strips should be left on the skin for 7-10 days.  If your surgeon used skin glue on the incision, you may shower in 24 hours.  The glue will flake off over the next 2-3 weeks.  Any sutures or  staples will be removed at the office during your follow-up visit. 8. ACTIVITIES:  You may resume regular (light) daily activities beginning the next day--such as daily self-care, walking, climbing stairs--gradually increasing activities as tolerated.  You may have sexual intercourse when it is comfortable.  Refrain from any heavy lifting or straining until approved by your doctor. a. You may drive when you are no longer taking prescription pain medication, you can comfortably wear a seatbelt, and you can safely maneuver your car and apply brakes. b. RETURN TO WORK:  __4 TO 6 WEEKS c. ________________________________________________________ 9. You should see your doctor in the office for a follow-up appointment approximately 2-3 weeks after your surgery.  Make sure that you call for this appointment within a day or two after you arrive home to insure a convenient appointment time. 10. OTHER INSTRUCTIONS:OK TO SHOWER STARTING TOMORROW 11. ICE PACK, TYLENOL, IBUPROFEN ALSO FOR PAIN 12. NO LIFTING MORE THAN 15 POUNDS FOR 2 WEEKS __________________________________________________________________________________________________________________________ __________________________________________________________________________________________________________________________ WHEN TO CALL YOUR DOCTOR: 1. Fever over 101.0 2. Inability to urinate 3. Continued bleeding from incision. 4. Increased pain, redness, or drainage from the incision. 5. Increasing abdominal pain  The clinic staff is available to answer your questions during regular business hours.  Please dont hesitate to call and ask to speak to one of the nurses for clinical concerns.  If you have a medical emergency, go to the nearest emergency room or call 911.  A surgeon from [facility] is always on call at the hospital. [address]  [address], [place], [place]  [address] ? [address] [address], [place], [place]   [address] [phone] ? [phone] ? FAX [phone] Web site: [email or web address]

## 2018-07-16 NOTE — Interval H&P Note (Signed)
History and Physical Interval Note: no change in H and P  07/16/2018 6:40 AM  Aldona Lento  has presented today for surgery, with the diagnosis of biliary dyskinesia  The various methods of treatment have been discussed with the patient and family. After consideration of risks, benefits and other options for treatment, the patient has consented to  Procedure(s): LAPAROSCOPIC CHOLECYSTECTOMY ERAS PATHWAY (N/A) as a surgical intervention .  The patient's history has been reviewed, patient examined, no change in status, stable for surgery.  I have reviewed the patient's chart and labs.  Questions were answered to the patient's satisfaction.     Abigail Miyamoto

## 2018-07-19 ENCOUNTER — Encounter (HOSPITAL_COMMUNITY): Payer: Self-pay | Admitting: Surgery

## 2018-08-06 ENCOUNTER — Other Ambulatory Visit: Payer: Self-pay | Admitting: Surgery

## 2018-10-29 ENCOUNTER — Other Ambulatory Visit: Payer: Self-pay | Admitting: Physician Assistant

## 2018-10-29 DIAGNOSIS — R079 Chest pain, unspecified: Secondary | ICD-10-CM

## 2018-10-29 DIAGNOSIS — K219 Gastro-esophageal reflux disease without esophagitis: Secondary | ICD-10-CM

## 2018-11-05 ENCOUNTER — Ambulatory Visit
Admission: RE | Admit: 2018-11-05 | Discharge: 2018-11-05 | Disposition: A | Discharge status: Home / Self Care | Payer: BC Managed Care – PPO | Source: Ambulatory Visit | Attending: Physician Assistant | Admitting: Physician Assistant

## 2018-11-05 DIAGNOSIS — K219 Gastro-esophageal reflux disease without esophagitis: Secondary | ICD-10-CM

## 2018-11-05 DIAGNOSIS — R079 Chest pain, unspecified: Secondary | ICD-10-CM

## 2019-02-25 ENCOUNTER — Other Ambulatory Visit: Payer: Self-pay | Admitting: Surgery

## 2019-04-04 ENCOUNTER — Other Ambulatory Visit (HOSPITAL_COMMUNITY): Payer: BC Managed Care – PPO

## 2019-04-14 ENCOUNTER — Other Ambulatory Visit: Payer: Self-pay

## 2019-04-14 ENCOUNTER — Encounter (HOSPITAL_BASED_OUTPATIENT_CLINIC_OR_DEPARTMENT_OTHER): Payer: Self-pay

## 2019-04-18 NOTE — Progress Notes (Signed)

## 2019-04-23 ENCOUNTER — Other Ambulatory Visit (HOSPITAL_COMMUNITY): Payer: BC Managed Care – PPO

## 2019-04-25 ENCOUNTER — Other Ambulatory Visit (HOSPITAL_COMMUNITY): Payer: BC Managed Care – PPO

## 2019-04-26 NOTE — H&P (Signed)
  Anthony Wilson  Location: [facility] Patient #: [id number] DOB: 1965/11/10 Married / Language: English / Race: White Male   History of Present Illness   The patient is a 53 year old male who presents with an umbilical hernia. This is a gentleman I have operated on in the past for biliary dyskinesia. He underwent a laparoscopic cholecystectomy. At that time, he had a small umbilical hernia which was repaired primarily as we could not use mesh during gallbladder surgery. He has now developed a recurrent hernia. He does significant heavy lifting at work and started noticing the hernia. It now causes discomfort but no obstructive symptoms. He reports it is slowly getting larger.   Allergies No Known Drug Allergies   Medication History  Sucralfate (1GM Tablet, Oral) Active. lamoTRIgine (200MG  Tablet, Oral) Active. oxyCODONE HCl (5MG  Tablet, Oral) Active. Dexilant (60MG  Capsule DR, Oral) Active. Pantoprazole Sodium (40MG  Tablet DR, Oral) Active. Medications Reconciled  Vitals   Weight: 204.2 lb Height: 66in Body Surface Area: 2.02 m Body Mass Index: 32.96 kg/m  Pulse: 91 (Regular)  BP: 130/78 (Sitting, Left Arm, Standard)       Physical Exam  The physical exam findings are as follows: Generally well in appearance Lungs clear CV RRR Ext normal Note:On exam, his abdomen is soft. There is an easily reducible moderate-sized umbilical hernia. It is nontender. Skin without erythema    Assessment & Plan  UMBILICAL HERNIA (Y81.4)  Impression: We again discussed the diagnosis of umbilical hernias. We would recommend an open repair with mesh. He would need to be out of work for 6 weeks postoperatively before he can return to any lifting. I discussed the surgical procedure with him and his wife in detail. We again discussed the risks which includes but is not limited to bleeding, infection, recurrence, injury to surrounding surgery, use of  mesh, cardiopulmonary issues, postoperative recovery, etc. He understands and wishes to proceed with surgery which will be scheduled.

## 2019-04-27 ENCOUNTER — Ambulatory Visit (HOSPITAL_BASED_OUTPATIENT_CLINIC_OR_DEPARTMENT_OTHER): Payer: BC Managed Care – PPO | Admitting: Anesthesiology

## 2019-04-27 ENCOUNTER — Encounter (HOSPITAL_BASED_OUTPATIENT_CLINIC_OR_DEPARTMENT_OTHER)
Admission: RE | Disposition: A | Discharge status: Home / Self Care | Payer: Self-pay | Source: Home / Self Care | Attending: Surgery

## 2019-04-27 ENCOUNTER — Encounter (HOSPITAL_BASED_OUTPATIENT_CLINIC_OR_DEPARTMENT_OTHER): Payer: Self-pay

## 2019-04-27 ENCOUNTER — Ambulatory Visit (HOSPITAL_BASED_OUTPATIENT_CLINIC_OR_DEPARTMENT_OTHER)
Admission: RE | Admit: 2019-04-27 | Discharge: 2019-04-27 | Disposition: A | Discharge status: Home / Self Care | Payer: BC Managed Care – PPO | Attending: Surgery | Admitting: Surgery

## 2019-04-27 ENCOUNTER — Other Ambulatory Visit: Payer: Self-pay

## 2019-04-27 DIAGNOSIS — Z79891 Long term (current) use of opiate analgesic: Secondary | ICD-10-CM | POA: Diagnosis not present

## 2019-04-27 DIAGNOSIS — K219 Gastro-esophageal reflux disease without esophagitis: Secondary | ICD-10-CM | POA: Diagnosis not present

## 2019-04-27 DIAGNOSIS — K429 Umbilical hernia without obstruction or gangrene: Secondary | ICD-10-CM | POA: Insufficient documentation

## 2019-04-27 DIAGNOSIS — Z79899 Other long term (current) drug therapy: Secondary | ICD-10-CM | POA: Diagnosis not present

## 2019-04-27 DIAGNOSIS — Z87891 Personal history of nicotine dependence: Secondary | ICD-10-CM | POA: Insufficient documentation

## 2019-04-27 HISTORY — PX: UMBILICAL HERNIA REPAIR: SHX196

## 2019-04-27 HISTORY — PX: INSERTION OF MESH: SHX5868

## 2019-04-27 SURGERY — REPAIR, HERNIA, UMBILICAL, ADULT
Anesthesia: General | Site: Abdomen

## 2019-04-27 MED ORDER — OXYCODONE HCL 5 MG/5ML PO SOLN: 5.0000 mg | Freq: Once | ORAL | Status: DC | PRN

## 2019-04-27 MED ORDER — GABAPENTIN 300 MG PO CAPS
ORAL_CAPSULE | ORAL | Status: AC
  Filled 2019-04-27: qty 1

## 2019-04-27 MED ORDER — DEXAMETHASONE SODIUM PHOSPHATE 4 MG/ML IJ SOLN
INTRAMUSCULAR | Status: DC | PRN
  Administered 2019-04-27: 10 mg via INTRAVENOUS

## 2019-04-27 MED ORDER — GABAPENTIN 300 MG PO CAPS
300.0000 mg | ORAL_CAPSULE | ORAL | Status: AC
  Administered 2019-04-27: 300 mg via ORAL

## 2019-04-27 MED ORDER — FENTANYL CITRATE (PF) 100 MCG/2ML IJ SOLN
INTRAMUSCULAR | Status: DC | PRN
  Administered 2019-04-27 (×2): 50 ug via INTRAVENOUS

## 2019-04-27 MED ORDER — LIDOCAINE 2% (20 MG/ML) 5 ML SYRINGE
INTRAMUSCULAR | Status: DC | PRN
  Administered 2019-04-27: 60 mg via INTRAVENOUS

## 2019-04-27 MED ORDER — PHENYLEPHRINE 40 MCG/ML (10ML) SYRINGE FOR IV PUSH (FOR BLOOD PRESSURE SUPPORT)
PREFILLED_SYRINGE | INTRAVENOUS | Status: DC | PRN
  Administered 2019-04-27 (×3): 80 ug via INTRAVENOUS

## 2019-04-27 MED ORDER — CELECOXIB 200 MG PO CAPS
200.0000 mg | ORAL_CAPSULE | ORAL | Status: AC
  Administered 2019-04-27: 200 mg via ORAL

## 2019-04-27 MED ORDER — LIDOCAINE HCL (PF) 1 % IJ SOLN
INTRAMUSCULAR | Status: AC
  Filled 2019-04-27: qty 30

## 2019-04-27 MED ORDER — ONDANSETRON HCL 4 MG/2ML IJ SOLN: 4.0000 mg | Freq: Four times a day (QID) | INTRAMUSCULAR | Status: DC | PRN

## 2019-04-27 MED ORDER — SODIUM BICARBONATE 4.2 % IV SOLN
INTRAVENOUS | Status: AC
  Filled 2019-04-27: qty 10

## 2019-04-27 MED ORDER — MIDAZOLAM HCL 5 MG/5ML IJ SOLN
INTRAMUSCULAR | Status: DC | PRN
  Administered 2019-04-27: 2 mg via INTRAVENOUS

## 2019-04-27 MED ORDER — CEFAZOLIN SODIUM-DEXTROSE 2-4 GM/100ML-% IV SOLN
2.0000 g | INTRAVENOUS | Status: AC
  Administered 2019-04-27: 2 g via INTRAVENOUS

## 2019-04-27 MED ORDER — MIDAZOLAM HCL 2 MG/2ML IJ SOLN: 1.0000 mg | INTRAMUSCULAR | Status: DC | PRN

## 2019-04-27 MED ORDER — PROPOFOL 10 MG/ML IV BOLUS
INTRAVENOUS | Status: DC | PRN
  Administered 2019-04-27: 200 mg via INTRAVENOUS

## 2019-04-27 MED ORDER — FENTANYL CITRATE (PF) 100 MCG/2ML IJ SOLN
25.0000 ug | INTRAMUSCULAR | Status: DC | PRN
  Administered 2019-04-27 (×2): 50 ug via INTRAVENOUS

## 2019-04-27 MED ORDER — ACETAMINOPHEN 500 MG PO TABS
1000.0000 mg | ORAL_TABLET | ORAL | Status: AC
  Administered 2019-04-27: 1000 mg via ORAL

## 2019-04-27 MED ORDER — ONDANSETRON HCL 4 MG/2ML IJ SOLN
INTRAMUSCULAR | Status: DC | PRN
  Administered 2019-04-27: 4 mg via INTRAVENOUS

## 2019-04-27 MED ORDER — OXYCODONE HCL 5 MG PO TABS: 5.0000 mg | ORAL_TABLET | Freq: Once | ORAL | Status: DC | PRN

## 2019-04-27 MED ORDER — ACETAMINOPHEN 500 MG PO TABS
ORAL_TABLET | ORAL | Status: AC
  Filled 2019-04-27: qty 2

## 2019-04-27 MED ORDER — BUPIVACAINE HCL (PF) 0.5 % IJ SOLN
INTRAMUSCULAR | Status: DC | PRN
  Administered 2019-04-27: 20 mL

## 2019-04-27 MED ORDER — LIDOCAINE-EPINEPHRINE 2 %-1:100000 IJ SOLN
INTRAMUSCULAR | Status: AC
  Filled 2019-04-27: qty 1

## 2019-04-27 MED ORDER — LACTATED RINGERS IV SOLN
INTRAVENOUS | Status: DC
  Administered 2019-04-27: 12:00:00 via INTRAVENOUS

## 2019-04-27 MED ORDER — FENTANYL CITRATE (PF) 100 MCG/2ML IJ SOLN
INTRAMUSCULAR | Status: AC
  Filled 2019-04-27: qty 2

## 2019-04-27 MED ORDER — CHLORHEXIDINE GLUCONATE CLOTH 2 % EX PADS: 6.0000 | MEDICATED_PAD | Freq: Once | CUTANEOUS | Status: DC

## 2019-04-27 MED ORDER — CEFAZOLIN SODIUM-DEXTROSE 2-4 GM/100ML-% IV SOLN
INTRAVENOUS | Status: AC
  Filled 2019-04-27: qty 100

## 2019-04-27 MED ORDER — FENTANYL CITRATE (PF) 100 MCG/2ML IJ SOLN: 50.0000 ug | INTRAMUSCULAR | Status: DC | PRN

## 2019-04-27 MED ORDER — CELECOXIB 200 MG PO CAPS
ORAL_CAPSULE | ORAL | Status: AC
  Filled 2019-04-27: qty 1

## 2019-04-27 MED ORDER — BUPIVACAINE HCL (PF) 0.5 % IJ SOLN
INTRAMUSCULAR | Status: AC
  Filled 2019-04-27: qty 30

## 2019-04-27 MED ORDER — OXYCODONE HCL 5 MG PO TABS: 5.0000 mg | ORAL_TABLET | Freq: Four times a day (QID) | ORAL | 0 refills | Status: DC | PRN

## 2019-04-27 MED ORDER — BUPIVACAINE HCL (PF) 0.25 % IJ SOLN
INTRAMUSCULAR | Status: AC
  Filled 2019-04-27: qty 30

## 2019-04-27 MED ORDER — PROPOFOL 10 MG/ML IV BOLUS
INTRAVENOUS | Status: AC
  Filled 2019-04-27: qty 20

## 2019-04-27 MED ORDER — MIDAZOLAM HCL 2 MG/2ML IJ SOLN
INTRAMUSCULAR | Status: AC
  Filled 2019-04-27: qty 2

## 2019-04-27 SURGICAL SUPPLY — 44 items
ADH SKN CLS APL DERMABOND .7 (GAUZE/BANDAGES/DRESSINGS) ×2
APL PRP STRL LF DISP 70% ISPRP (MISCELLANEOUS) ×1
BLADE CLIPPER SURG (BLADE) ×2 IMPLANT
BLADE HEX COATED 2.75 (ELECTRODE) ×3 IMPLANT
BLADE SURG 15 STRL LF DISP TIS (BLADE) ×1 IMPLANT
BLADE SURG 15 STRL SS (BLADE) ×3
CANISTER SUCT 1200ML W/VALVE (MISCELLANEOUS) IMPLANT
CHLORAPREP W/TINT 26 (MISCELLANEOUS) ×3 IMPLANT
COVER BACK TABLE REUSABLE LG (DRAPES) ×3 IMPLANT
COVER MAYO STAND REUSABLE (DRAPES) ×3 IMPLANT
DECANTER SPIKE VIAL GLASS SM (MISCELLANEOUS) IMPLANT
DERMABOND ADVANCED (GAUZE/BANDAGES/DRESSINGS) ×4
DERMABOND ADVANCED .7 DNX12 (GAUZE/BANDAGES/DRESSINGS) ×2 IMPLANT
DRAPE LAPAROTOMY 100X72 PEDS (DRAPES) ×3 IMPLANT
DRAPE UTILITY XL STRL (DRAPES) ×3 IMPLANT
DRSG TEGADERM 2-3/8X2-3/4 SM (GAUZE/BANDAGES/DRESSINGS) IMPLANT
ELECT REM PT RETURN 9FT ADLT (ELECTROSURGICAL) ×3
ELECTRODE REM PT RTRN 9FT ADLT (ELECTROSURGICAL) ×1 IMPLANT
GLOVE SURG SIGNA 7.5 PF LTX (GLOVE) ×3 IMPLANT
GOWN STRL REUS W/ TWL LRG LVL3 (GOWN DISPOSABLE) ×1 IMPLANT
GOWN STRL REUS W/ TWL XL LVL3 (GOWN DISPOSABLE) ×1 IMPLANT
GOWN STRL REUS W/TWL LRG LVL3 (GOWN DISPOSABLE) ×3
GOWN STRL REUS W/TWL XL LVL3 (GOWN DISPOSABLE) ×3
MESH VENTRALEX ST 2.5 CRC MED (Mesh General) ×2 IMPLANT
NDL HYPO 25X1 1.5 SAFETY (NEEDLE) ×1 IMPLANT
NEEDLE HYPO 25X1 1.5 SAFETY (NEEDLE) ×3 IMPLANT
NS IRRIG 1000ML POUR BTL (IV SOLUTION) ×2 IMPLANT
PACK BASIN DAY SURGERY FS (CUSTOM PROCEDURE TRAY) ×3 IMPLANT
PENCIL SMOKE EVACUATOR (MISCELLANEOUS) ×3 IMPLANT
SLEEVE SCD COMPRESS KNEE MED (MISCELLANEOUS) ×3 IMPLANT
SPONGE LAP 4X18 RFD (DISPOSABLE) ×2 IMPLANT
SUT MNCRL AB 4-0 PS2 18 (SUTURE) ×3 IMPLANT
SUT NOVA 0 T19/GS 22DT (SUTURE) IMPLANT
SUT NOVA NAB DX-16 0-1 5-0 T12 (SUTURE) ×2 IMPLANT
SUT NOVA NAB GS-21 1 T12 (SUTURE) ×2 IMPLANT
SUT VIC AB 2-0 SH 27 (SUTURE)
SUT VIC AB 2-0 SH 27XBRD (SUTURE) IMPLANT
SUT VIC AB 3-0 SH 27 (SUTURE) ×3
SUT VIC AB 3-0 SH 27X BRD (SUTURE) ×1 IMPLANT
SYR CONTROL 10ML LL (SYRINGE) ×3 IMPLANT
TOWEL GREEN STERILE FF (TOWEL DISPOSABLE) ×3 IMPLANT
TUBE CONNECTING 20'X1/4 (TUBING)
TUBE CONNECTING 20X1/4 (TUBING) IMPLANT
YANKAUER SUCT BULB TIP NO VENT (SUCTIONS) IMPLANT

## 2019-04-27 NOTE — Discharge Instructions (Signed)
[facility] _______[facility]  UMBILICAL OR INGUINAL HERNIA REPAIR: POST OP INSTRUCTIONS  Always review your discharge instruction sheet given to you by the facility where your surgery was performed. IF YOU HAVE DISABILITY OR FAMILY LEAVE FORMS, YOU MUST BRING THEM TO THE OFFICE FOR PROCESSING.   DO NOT GIVE THEM TO YOUR DOCTOR.  1. A  prescription for pain medication may be given to you upon discharge.  Take your pain medication as prescribed, if needed.  If narcotic pain medicine is not needed, then you may take acetaminophen (Tylenol) or ibuprofen (Advil) as needed. 2. Take your usually prescribed medications unless otherwise directed. If you need a refill on your pain medication, please contact your pharmacy.  They will contact our office to request authorization. Prescriptions will not be filled after 5 pm or on week-ends. 3. You should follow a light diet the first 24 hours after arrival home, such as soup and crackers, etc.  Be sure to include lots of fluids daily.  Resume your normal diet the day after surgery. 4.Most patients will experience some swelling and bruising around the umbilicus or in the groin and scrotum.  Ice packs and reclining will help.  Swelling and bruising can take several days to resolve.  6. It is common to experience some constipation if taking pain medication after surgery.  Increasing fluid intake and taking a stool softener (such as Colace) will usually help or prevent this problem from occurring.  A mild laxative (Milk of Magnesia or Miralax) should be taken according to package directions if there are no bowel movements after 48 hours. 7. Unless discharge instructions indicate otherwise, you may remove your bandages 24-48 hours after surgery, and you may shower at that time.  You may have steri-strips (small skin tapes) in place directly over the incision.  These strips should be left on the skin for 7-10 days.  If your surgeon used skin glue on the  incision, you may shower in 24 hours.  The glue will flake off over the next 2-3 weeks.  Any sutures or staples will be removed at the office during your follow-up visit. 8. ACTIVITIES:  You may resume regular (light) daily activities beginning the next day--such as daily self-care, walking, climbing stairs--gradually increasing activities as tolerated.  You may have sexual intercourse when it is comfortable.  Refrain from any heavy lifting or straining until approved by your doctor.  a.You may drive when you are no longer taking prescription pain medication, you can comfortably wear a seatbelt, and you can safely maneuver your car and apply brakes. b.RETURN TO WORK:   _____________________________________________  9.You should see your doctor in the office for a follow-up appointment approximately 2-3 weeks after your surgery.  Make sure that you call for this appointment within a day or two after you arrive home to insure a convenient appointment time. 10.OTHER INSTRUCTIONS: __OK TO SHOWER STARTING TOMORROW ICE PACK, TYLENOL, IBUPROFEN ALSO FOR PAIN NO LIFTING MORE THAN 15 TO 20 POUNDS FOR AT LEAST 4 WEEKS_______________________    _____________________________________  WHEN TO CALL YOUR DOCTOR: 1. Fever over 101.0 2. Inability to urinate 3. Nausea and/or vomiting 4. Extreme swelling or bruising 5. Continued bleeding from incision. 6. Increased pain, redness, or drainage from the incision  The clinic staff is available to answer your questions during regular business hours.  Please dont hesitate to call and ask to speak to one of the nurses for clinical concerns.  If you have a medical emergency, go  to the nearest emergency room or call 911.  A surgeon from [facility] is always on call at the hospital   [address] [place], [place], [place]  [address] ?  [address], [place], [place]   [address] [phone] ? [phone] ? FAX [phone] Web site:  [email or web address]   Post Anesthesia Home Care Instructions  Activity: Get plenty of rest for the remainder of the day. A responsible individual must stay with you for 24 hours following the procedure.  For the next 24 hours, DO NOT: -Drive a car -Paediatric nurse -Drink alcoholic beverages -Take any medication unless instructed by your physician -Make any legal decisions or sign important papers.  Meals: Start with liquid foods such as gelatin or soup. Progress to regular foods as tolerated. Avoid greasy, spicy, heavy foods. If nausea and/or vomiting occur, drink only clear liquids until the nausea and/or vomiting subsides. Call your physician if vomiting continues.  Special Instructions/Symptoms: Your throat may feel dry or sore from the anesthesia or the breathing tube placed in your throat during surgery. If this causes discomfort, gargle with warm salt water. The discomfort should disappear within 24 hours.  If you had a scopolamine patch placed behind your ear for the management of post- operative nausea and/or vomiting:  1. The medication in the patch is effective for 72 hours, after which it should be removed.  Wrap patch in a tissue and discard in the trash. Wash hands thoroughly with soap and water. 2. You may remove the patch earlier than 72 hours if you experience unpleasant side effects which may include dry mouth, dizziness or visual disturbances. 3. Avoid touching the patch. Wash your hands with soap and water after contact with the patch.

## 2019-04-27 NOTE — Anesthesia Procedure Notes (Signed)
Procedure Name: LMA Insertion Date/Time: 04/27/2019 12:58 PM Performed by: Lieutenant Diego, CRNA Pre-anesthesia Checklist: Patient identified, Emergency Drugs available, Suction available and Patient being monitored Patient Re-evaluated:Patient Re-evaluated prior to induction Oxygen Delivery Method: Circle system utilized Preoxygenation: Pre-oxygenation with 100% oxygen Induction Type: IV induction Ventilation: Mask ventilation without difficulty LMA: LMA inserted LMA Size: 4.0 Number of attempts: 1 Placement Confirmation: positive ETCO2 and breath sounds checked- equal and bilateral Tube secured with: Tape Dental Injury: Teeth and Oropharynx as per pre-operative assessment

## 2019-04-27 NOTE — Anesthesia Preprocedure Evaluation (Signed)
Anesthesia Evaluation  Patient identified by MRN, date of birth, ID band Patient awake    Reviewed: Allergy & Precautions, H&P , NPO status , Patient's Chart, lab work & pertinent test results  Airway Mallampati: II   Neck ROM: full    Dental   Pulmonary former smoker,    breath sounds clear to auscultation       Cardiovascular negative cardio ROS   Rhythm:regular Rate:Normal     Neuro/Psych    GI/Hepatic GERD  ,  Endo/Other    Renal/GU      Musculoskeletal   Abdominal   Peds  Hematology   Anesthesia Other Findings   Reproductive/Obstetrics                             Anesthesia Physical Anesthesia Plan  ASA: II  Anesthesia Plan: General   Post-op Pain Management:    Induction: Intravenous  PONV Risk Score and Plan: 2 and Ondansetron, Dexamethasone, Midazolam and Treatment may vary due to age or medical condition  Airway Management Planned: LMA  Additional Equipment:   Intra-op Plan:   Post-operative Plan:   Informed Consent: I have reviewed the patients History and Physical, chart, labs and discussed the procedure including the risks, benefits and alternatives for the proposed anesthesia with the patient or authorized representative who has indicated his/her understanding and acceptance.       Plan Discussed with: CRNA, Anesthesiologist and Surgeon  Anesthesia Plan Comments:         Anesthesia Quick Evaluation

## 2019-04-27 NOTE — Transfer of Care (Signed)
Immediate Anesthesia Transfer of Care Note  Patient: Anthony Wilson  Procedure(s) Performed: UMBILICAL HERNIA REPAIR WITH MESH (N/A Abdomen) INSERTION OF MESH (N/A Abdomen)  Patient Location: PACU  Anesthesia Type:General  Level of Consciousness: drowsy  Airway & Oxygen Therapy: Patient Spontanous Breathing and Patient connected to face mask oxygen  Post-op Assessment: Report given to RN and Post -op Vital signs reviewed and stable  Post vital signs: Reviewed and stable  Last Vitals:  Vitals Value Taken Time  BP 110/71 04/27/19 1332  Temp    Pulse 94 04/27/19 1332  Resp 13 04/27/19 1332  SpO2 95 % 04/27/19 1332    Last Pain:  Vitals:   04/27/19 1125  TempSrc: Tympanic  PainSc: 0-No pain      Patients Stated Pain Goal: 6 (19/50/93 2671)  Complications: No apparent anesthesia complications

## 2019-04-27 NOTE — Interval H&P Note (Signed)
History and Physical Interval Note: no change in H and P  04/27/2019 12:20 PM  Anthony Wilson  has presented today for surgery, with the diagnosis of RECURRENT UMBILICAL HERNIA.  The various methods of treatment have been discussed with the patient and family. After consideration of risks, benefits and other options for treatment, the patient has consented to  Procedure(s) with comments: [place] (N/A) - LMA ANESTHESIA as a surgical intervention.  The patient's history has been reviewed, patient examined, no change in status, stable for surgery.  I have reviewed the patient's chart and labs.  Questions were answered to the patient's satisfaction.     Coralie Keens

## 2019-04-27 NOTE — Op Note (Signed)
UMBILICAL HERNIA REPAIR WITH MESH, INSERTION OF MESH  Procedure Note  Anthony Wilson 04/27/2019   Pre-op Diagnosis: RECURRENT UMBILICAL HERNIA     Post-op Diagnosis: same  Procedure(s): UMBILICAL HERNIA REPAIR WITH MESH INSERTION OF MESH (6.4 cm round ventral prolene patch from Bard)  Surgeon(s): Coralie Keens, MD  Anesthesia: Monitor Anesthesia Care  Staff:  Circulator: Izora Ribas, RN Relief Circulator: Humberto Seals, RN Relief Scrub: Ezekiel Ina S Scrub Person: Lorenza Burton, CST  Estimated Blood Loss: Minimal               Procedure: Patient brought to the operating identifies correct patient.  He is placed upon the operating table and general anesthesia was induced.  His abdomen was prepped and draped in the usual sterile fashion.  I anesthetized skin around the previous scar of the umbilicus with Marcaine.  I then made a longitudinal incision with a scalpel.  I dissected down to the hernia defect.  I excised the sac.  The fascial defect was approximately 2 cm in size.  I freed up the fascia from the overlying subcutaneous tissue circumferentially around the fascial defect.  I then brought a 6.4 cm round Prolene ventral patch from Bard onto the field.  I placed it through the fascia opening and then pulled up against the peritoneum.  It was then sutured in place circumferentially with #1 Novafil sutures.  I then closed the fascia over the top of the mesh with 2 separate figure-of-eight #1 Novafil sutures as well.  Wide coverage of the fascial defect appeared to be achieved.  I anesthetized the fascia further with Marcaine.  I then closed the subcutaneous tissue with interrupted 3-0 Vicryl sutures and closed skin with a running 4-0 Monocryl.  Dermabond was then applied.  The patient tolerated the procedure well.  All the counts were correct at the end the procedure.  The patient was then extubated in the operating room and taken in stable addition to the recovery  room.          Coralie Keens   Date: 04/27/2019  Time: 1:27 PM

## 2019-04-28 ENCOUNTER — Encounter (HOSPITAL_BASED_OUTPATIENT_CLINIC_OR_DEPARTMENT_OTHER): Payer: Self-pay | Admitting: Surgery

## 2019-04-29 NOTE — Anesthesia Postprocedure Evaluation (Signed)
Anesthesia Post Note  Patient: Anthony Wilson  Procedure(s) Performed: UMBILICAL HERNIA REPAIR WITH MESH (N/A Abdomen) INSERTION OF MESH (N/A Abdomen)     Patient location during evaluation: PACU Anesthesia Type: General Level of consciousness: awake and alert Pain management: pain level controlled Vital Signs Assessment: post-procedure vital signs reviewed and stable Respiratory status: spontaneous breathing, nonlabored ventilation, respiratory function stable and patient connected to nasal cannula oxygen Cardiovascular status: blood pressure returned to baseline and stable Postop Assessment: no apparent nausea or vomiting Anesthetic complications: no    Last Vitals:  Vitals:   04/27/19 1415 04/27/19 1440  BP: 121/83 136/82  Pulse: 85 85  Resp: 15 16  Temp:  36.7 C  SpO2: 96% 95%    Last Pain:  Vitals:   04/27/19 1440  TempSrc:   PainSc: [name]

## 2020-01-21 IMAGING — CR DG CHEST 2V
2 series · 2 of 2 positions shown · non-contrast
Comparison: December 26, 2013

CLINICAL DATA: Right-sided chest pain under right breast for 2
years.

EXAM:
CHEST - 2 VIEW

[w chest pa]
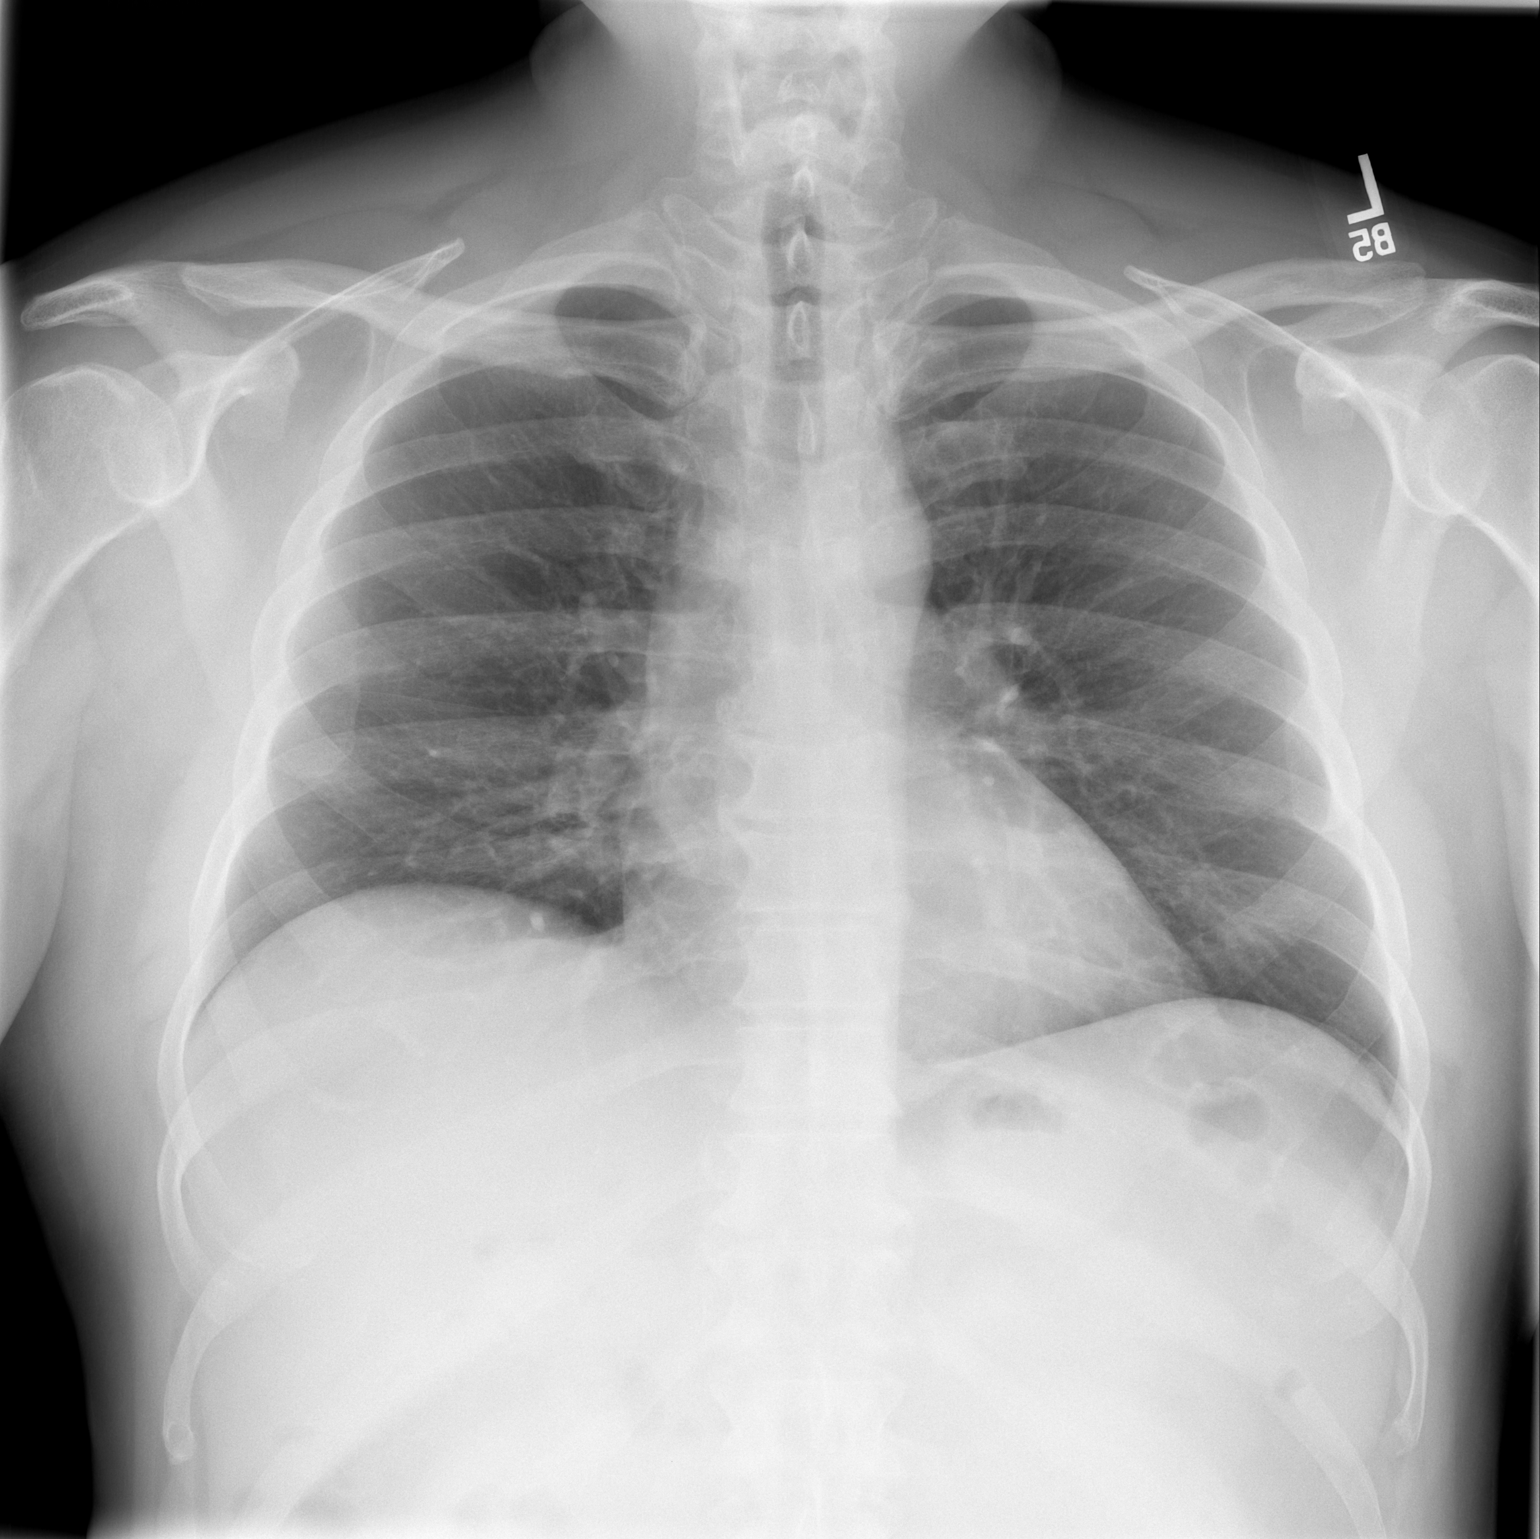

[w chest lat]
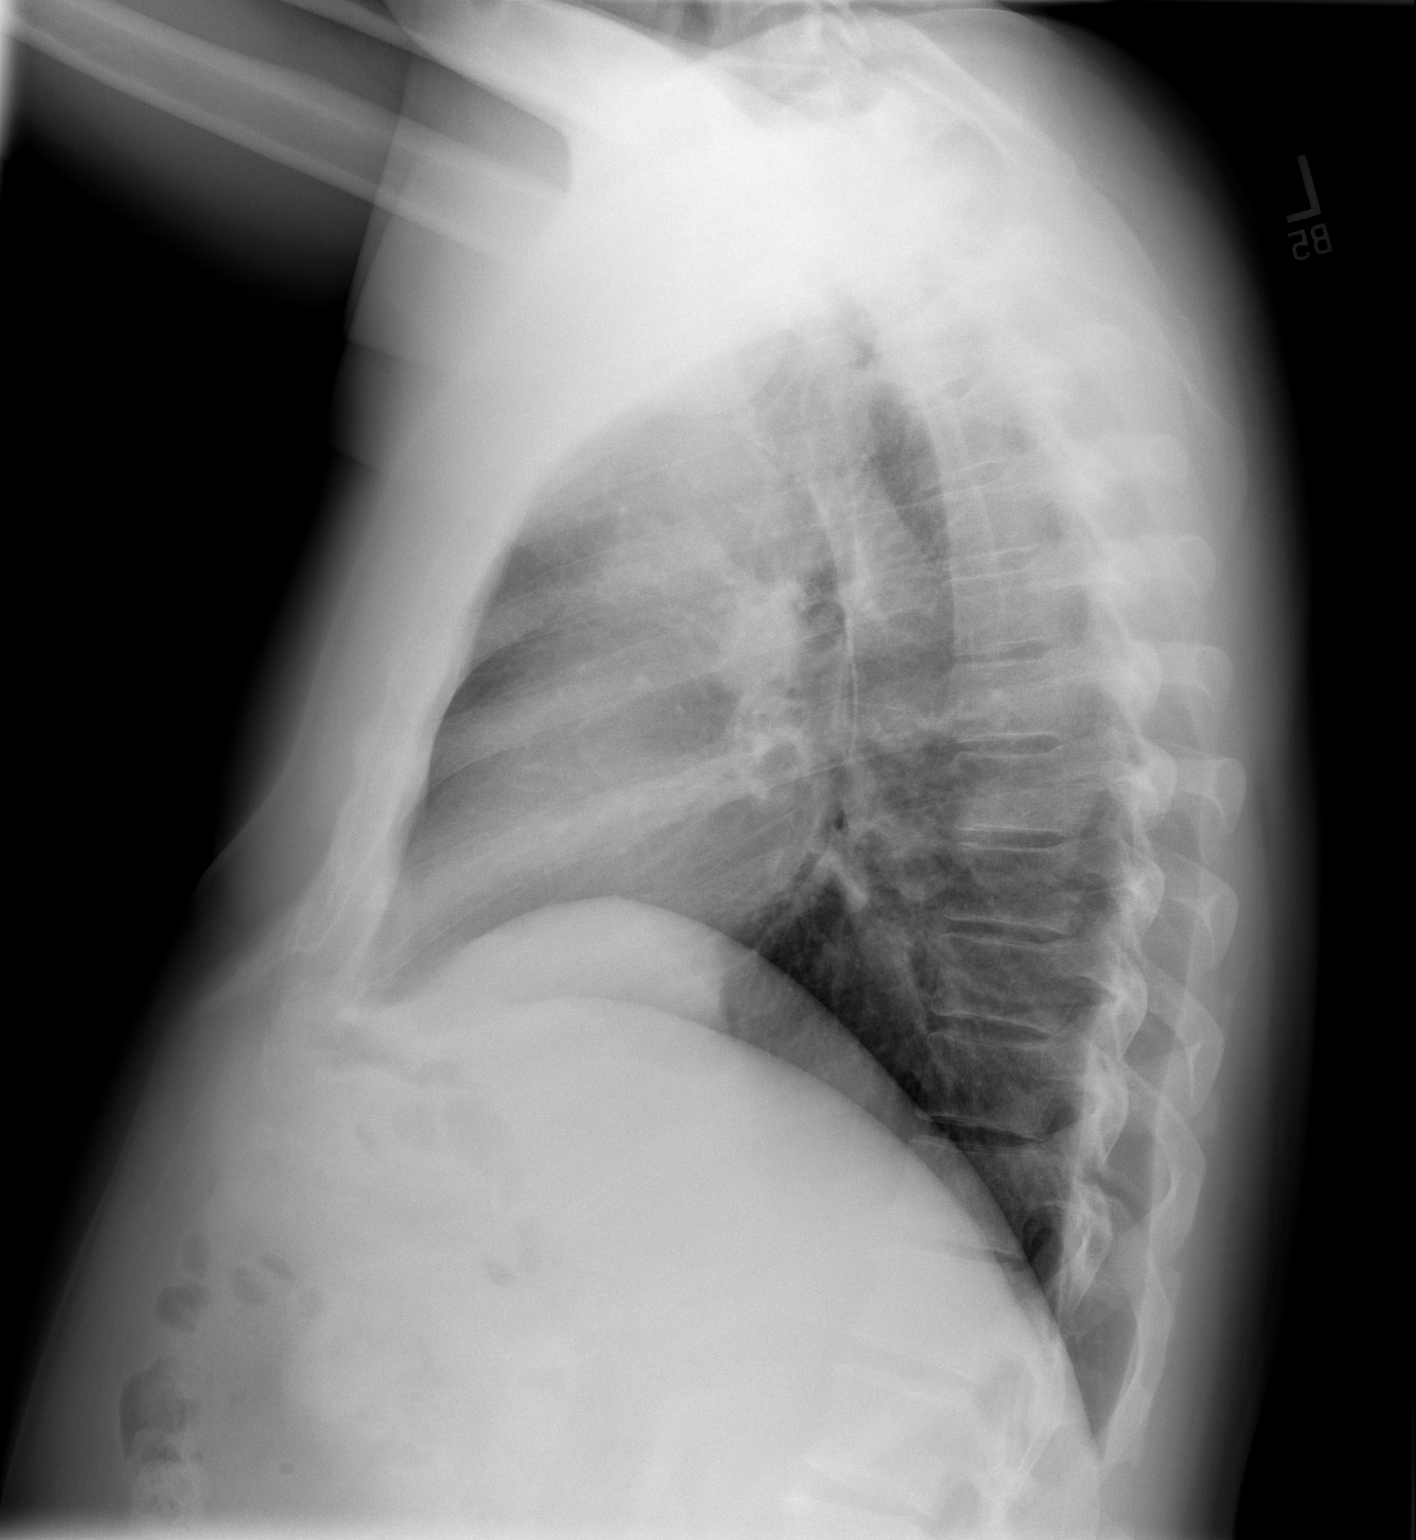

[2 of 2 positions shown; findings below may reference images not displayed]

FINDINGS: The heart size and mediastinal contours are within normal limits.
Both lungs are clear. The visualized skeletal structures are
unremarkable.
IMPRESSION: No active cardiopulmonary disease.

## 2020-04-23 NOTE — Progress Notes (Signed)
Podiatric Surgery Patient Visit    Patient Name: Rather,[name]  Assessment:   -Patient is 54 y.o. male with suspected left plantar fascial tear  X-rays-negative  Hx of right achilles tendon rupture repair    Impression: Plantar fascial tear (Acute complicated)    Plan:   -X-ray report from imaging of the ankle done at [facility] was reviewed today  -X-ray imaging of the foot was obtained today to further evaluate for injury.  Imaging was negative for fracture  -Discussed diagnosis in detail with patient.  Suspect he has partially torn the medial band of his plantar fascia.  Discussed that this only requires conservative therapy including rest, ice, elevation, activity modification until pain improves.  [place] the ligament does not require surgical repair.  -Recommend gentle stretching  -Recommend arch supports in the future  -Cam boot was provided to patient to help with immobilization and weightbearing during his recovery.  -F/U 6 weeks for reevaluation    ----------------------------------------------------------------------------------------------------------------------------  Amount/Complexity data reviewed (Choose 1 for level 2/3/4, 2 for level 5):     A) Independent Radiology interpretation: No    B) (any 3):   New Radiology/Labs ordered: No  Outside Radiology reports/Labs reviewed: Yes x-rays at urgent care  Involve Independent historian: No  External provider notes reviewed: Yes urgent care records    C) External provider discussion: No    Risk of Problem/Management: Moderate (99204/99214)      Subjective:     Chief Complaint:   Chief Complaint   Patient presents with    Foot Pain     Left Heel pain       HPI:  Raymond Austin is a 54 y.o. male.  Patient is here chief complaint of left foot injury.  He is a new patient.  Injury occurred about 2 weeks ago.  He was playing soccer.  He planted his foot and felt the sensation of "clothes tearing" in the arch of his foot.  He denies significant bruising  or swelling but he had immediate pain is had pain ever since.  He is having difficulty walking and standing.  He went to urgent care at [facility] and they did x-rays of his ankle.  He does not have the imaging with him.  He has a history of right Achilles tendon rupture with some continued weakness despite repair.      Denies N/V/F/C/SOB/CP  All other systems were reviewed and are negative  The following portions of the patient's history were reviewed and updated as appropriate: allergies, current medications, past medical history, past surgical history, family history, and problem list.    Physical Exam:     Vitals:    04/24/20 1115   BP: 134/88   Pulse: 85   SpO2: 98%       Gen: AAOx3, NAD, Well developed  HEENT: Normocephalic, EOMI  Heart: Regular pulse rate and rhythm  Vascular:  Palpable pedal pulses b/l, CFT to all toes <3 sec. No edema. No varicosities  Derm: Skin incision of the right Achilles is well-healed.  Skin intact without wounds or rashes.  No erythema or ecchymosis.  MSK: Left plantar fascia is exquisitely tender over the medial band where there is a palpable decrease in tone consistent with at least partial tear of the medial band of the plantar fascia.  There is an obvious difference in this area when you compare the right intact plantar fascia to the left injured side.  Right Achilles is thickened and bulbous status post repair  after rupture.  MMS 5/5 and symmetric, No other areas of pain or tenderness. No gross deformity. ROM normal without crepitus.  Neuro:  SILT. Neg tinel's sign.  Able to wiggle toes. No motor deficits.     Standing and gait exam: Gait is antalgic      Radiology:     Xrays taken in clinic today: X-ray imaging of the left foot was obtained multiple weightbearing views: Imaging is negative for fracture or dislocation.  No acute bone injury.  Imaging is essentially normal.    Report from imaging at [facility] was reviewed-imaging of the ankle was negative for fracture.        Claudette Head, DPM FACFAS  [facility] Podiatric Surgery  Pager [phone]    ------------------------------------------------------------------------------------------------------------------------------  The review of the patient's medications does not in any way constitute an endorsement, by this clinician,  of their use, dosage, indications, route, efficacy, interactions, or other clinical parameters.    This note was generated within the EPIC EMR using Dragon medical speech recognition software and may contain inherent errors or omissions not intended by the user. Grammatical and punctuation errors, random word insertions, deletions, pronoun errors and incomplete sentences are occasional consequences of this technology due to software limitations. Not all errors are caught or corrected.  Although every attempt is made to root out erroneus and incomplete transcription, the note may still not fully represent the intent or opinion of the author. If there are questions or concerns about the content of this note or information contained within the body of this dictation they should be addressed directly with the author for clarification.

## 2020-04-24 ENCOUNTER — Ambulatory Visit (INDEPENDENT_AMBULATORY_CARE_PROVIDER_SITE_OTHER): Payer: Commercial Managed Care - POS

## 2020-04-24 ENCOUNTER — Ambulatory Visit (INDEPENDENT_AMBULATORY_CARE_PROVIDER_SITE_OTHER): Payer: Commercial Managed Care - POS | Admitting: Foot & Ankle Surgery

## 2020-04-24 ENCOUNTER — Encounter (INDEPENDENT_AMBULATORY_CARE_PROVIDER_SITE_OTHER): Payer: Self-pay | Admitting: Foot & Ankle Surgery

## 2020-04-24 VITALS — BP 134/88 | HR 85 | Wt 280.0 lb

## 2020-04-24 DIAGNOSIS — M79672 Pain in left foot: Secondary | ICD-10-CM

## 2020-04-24 DIAGNOSIS — S93692A Other sprain of left foot, initial encounter: Secondary | ICD-10-CM

## 2022-04-02 ENCOUNTER — Encounter (INDEPENDENT_AMBULATORY_CARE_PROVIDER_SITE_OTHER): Payer: Self-pay

## 2022-05-02 ENCOUNTER — Encounter (INDEPENDENT_AMBULATORY_CARE_PROVIDER_SITE_OTHER): Payer: Self-pay

## 2022-06-02 ENCOUNTER — Encounter (INDEPENDENT_AMBULATORY_CARE_PROVIDER_SITE_OTHER): Payer: Self-pay

## 2022-07-03 ENCOUNTER — Encounter (INDEPENDENT_AMBULATORY_CARE_PROVIDER_SITE_OTHER): Payer: Self-pay

## 2022-08-01 ENCOUNTER — Encounter (INDEPENDENT_AMBULATORY_CARE_PROVIDER_SITE_OTHER): Payer: Self-pay

## 2022-09-01 ENCOUNTER — Encounter (INDEPENDENT_AMBULATORY_CARE_PROVIDER_SITE_OTHER): Payer: Self-pay

## 2023-04-10 ENCOUNTER — Other Ambulatory Visit: Payer: Self-pay | Admitting: Surgery

## 2023-05-01 ENCOUNTER — Encounter (HOSPITAL_BASED_OUTPATIENT_CLINIC_OR_DEPARTMENT_OTHER): Payer: Self-pay | Admitting: Surgery

## 2023-05-01 ENCOUNTER — Other Ambulatory Visit: Payer: Self-pay

## 2023-05-04 MED ORDER — CHLORHEXIDINE GLUCONATE CLOTH 2 % EX PADS: 6.0000 | MEDICATED_PAD | Freq: Once | CUTANEOUS | Status: DC

## 2023-05-04 MED ORDER — CHLORHEXIDINE GLUCONATE CLOTH 2 % EX PADS
6.0000 | MEDICATED_PAD | Freq: Once | CUTANEOUS | Status: DC
Start: 2023-05-04 — End: 2023-05-07

## 2023-05-04 MED ORDER — ENSURE PRE-SURGERY PO LIQD: 296.0000 mL | Freq: Once | ORAL | Status: DC

## 2023-05-04 NOTE — Progress Notes (Signed)

## 2023-05-06 NOTE — Anesthesia Preprocedure Evaluation (Signed)
Anesthesia Evaluation  Patient identified by MRN, date of birth, ID band Patient awake    Reviewed: Allergy & Precautions, NPO status , Patient's Chart, lab work & pertinent test results  History of Anesthesia Complications Negative for: history of anesthetic complications  Airway Mallampati: II  TM Distance: >3 FB Neck ROM: Full   Comment: Previous grade II view with MAC 4, easy mask Dental  (+) Dental Advisory Given,    Pulmonary neg shortness of breath, neg sleep apnea, neg COPD, neg recent URI, former smoker   Pulmonary exam normal breath sounds clear to auscultation       Cardiovascular negative cardio ROS  Rhythm:Regular Rate:Normal     Neuro/Psych negative neurological ROS     GI/Hepatic Neg liver ROS,GERD  Medicated,,Celiac disease   Endo/Other  negative endocrine ROS    Renal/GU negative Renal ROS     Musculoskeletal   Abdominal  (+) + obese  Peds  Hematology negative hematology ROS (+)   Anesthesia Other Findings   Reproductive/Obstetrics                             Anesthesia Physical Anesthesia Plan  ASA: 2  Anesthesia Plan: General   Post-op Pain Management: Tylenol PO (pre-op)*   Induction: Intravenous  PONV Risk Score and Plan: 2 and Ondansetron, Dexamethasone and Treatment may vary due to age or medical condition  Airway Management Planned: LMA  Additional Equipment:   Intra-op Plan:   Post-operative Plan: Extubation in OR  Informed Consent: I have reviewed the patients History and Physical, chart, labs and discussed the procedure including the risks, benefits and alternatives for the proposed anesthesia with the patient or authorized representative who has indicated his/her understanding and acceptance.     Dental advisory given  Plan Discussed with: CRNA and Anesthesiologist  Anesthesia Plan Comments: (Risks of general anesthesia discussed including,  but not limited to, sore throat, hoarse voice, chipped/damaged teeth, injury to vocal cords, nausea and vomiting, allergic reactions, lung infection, heart attack, stroke, and death. All questions answered. )        Anesthesia Quick Evaluation

## 2023-05-06 NOTE — H&P (Signed)
  PROVIDER: Wayne Both, MD  MRN: [id number] DOB: [birth year]  Subjective   Chief Complaint: New Consultation (Eval of umb her rpr 04/27/2019/DB, Mesh possible pertruding)   History of Present Illness: Anthony Wilson is a 57 y.o. male who is seen for a long-term follow-up. He underwent repair of an umbilical hernia with mesh in December 2020. At that time he had a 2 cm fascial defect which was repaired with a 6.4 cm round ventral patch from Bard. He has developed a small protrusion at the incision and feels like either a suture or mesh is poking through the skin. It causes discomfort and occasionally snagged on clothing but has never had any drainage. He is otherwise doing well. He has had intentional weight loss..    Review of Systems: A complete review of systems was obtained from the patient. I have reviewed this information and discussed as appropriate with the patient. See HPI as well for other ROS.  ROS   Medical History: Past Medical History:  Diagnosis Date  GERD (gastroesophageal reflux disease)   There is no problem list on file for this patient.  Past Surgical History:  Procedure Laterality Date  CHOLECYSTECTOMY  HERNIA REPAIR  shoulder surgery    No Known Allergies  Current Outpatient Medications on File Prior to Visit  Medication Sig Dispense Refill  ergocalciferol, vitamin D2, 1,250 mcg (50,000 unit) capsule Take 50,000 Units by mouth once a week  pantoprazole (PROTONIX) 40 MG DR tablet Take 40 mg by mouth once daily   No current facility-administered medications on file prior to visit.   Family History  Problem Relation Age of Onset  Breast cancer Mother  Skin cancer Father  Obesity Father  Diabetes Father    Social History   Tobacco Use  Smoking Status Former  Types: Cigarettes  Smokeless Tobacco Never    Social History   Socioeconomic History  Marital status: Married  Tobacco Use  Smoking status: Former  Types: Cigarettes   Smokeless tobacco: Never  Vaping Use  Vaping status: Never Used  Substance and Sexual Activity  Alcohol use: Yes  Comment: ocassionally  Drug use: Never   Objective:   Vitals:   BP: (!) 148/84  Pulse: (!) 116  Temp: 36.7 C (98 F)  SpO2: 97%  Weight: 88.8 kg (195 lb 12.8 oz)  Height: 167.6 cm (5\' 6" )  PainSc: 0-No pain   Body mass index is 31.6 kg/m.  Physical Exam   He appears well on exam  There is a stitch granuloma at the site of the hernia repair on the abdominal wall. It is tender but there is no erythema and no open wound. I can also palpate some of the other suture material. The hernia repair feels intact  Labs, Imaging and Diagnostic Testing:  Assessment and Plan:   Diagnoses and all orders for this visit:  Stitch granuloma, initial encounter    This is a painful stitch granuloma of the abdominal wall. I recommend surgical excision of the granuloma and underlying suture material to help relieve his symptoms of discomfort and avoid potential infection or even infection of the placed mesh. I explained the diagnosis to him in detail. We discussed the risk of surgery which includes but is not limited to bleeding, infection, the need to remove other sutures or the mesh, cardiopulmonary issues with anesthesia, injury to surrounding structures, postoperative recovery, etc. He understands and wished to proceed with surgery which will be scheduled

## 2023-05-07 ENCOUNTER — Other Ambulatory Visit: Payer: Self-pay

## 2023-05-07 ENCOUNTER — Encounter (HOSPITAL_BASED_OUTPATIENT_CLINIC_OR_DEPARTMENT_OTHER)
Admission: RE | Disposition: A | Discharge status: Home / Self Care | Payer: Self-pay | Source: Home / Self Care | Attending: Surgery

## 2023-05-07 ENCOUNTER — Ambulatory Visit (HOSPITAL_BASED_OUTPATIENT_CLINIC_OR_DEPARTMENT_OTHER): Payer: Self-pay | Admitting: Anesthesiology

## 2023-05-07 ENCOUNTER — Ambulatory Visit (HOSPITAL_BASED_OUTPATIENT_CLINIC_OR_DEPARTMENT_OTHER)
Admission: RE | Admit: 2023-05-07 | Discharge: 2023-05-07 | Disposition: A | Discharge status: Home / Self Care | Payer: BC Managed Care – PPO | Attending: Surgery | Admitting: Surgery

## 2023-05-07 ENCOUNTER — Encounter (HOSPITAL_BASED_OUTPATIENT_CLINIC_OR_DEPARTMENT_OTHER): Payer: Self-pay | Admitting: Surgery

## 2023-05-07 DIAGNOSIS — T8189XA Other complications of procedures, not elsewhere classified, initial encounter: Secondary | ICD-10-CM | POA: Diagnosis present

## 2023-05-07 DIAGNOSIS — K219 Gastro-esophageal reflux disease without esophagitis: Secondary | ICD-10-CM | POA: Diagnosis not present

## 2023-05-07 DIAGNOSIS — X58XXXA Exposure to other specified factors, initial encounter: Secondary | ICD-10-CM | POA: Diagnosis not present

## 2023-05-07 DIAGNOSIS — Z87891 Personal history of nicotine dependence: Secondary | ICD-10-CM | POA: Diagnosis not present

## 2023-05-07 HISTORY — PX: FOREIGN BODY REMOVAL ABDOMINAL: SHX5319

## 2023-05-07 SURGERY — REMOVAL, FOREIGN BODY, ABDOMEN
Anesthesia: General | Site: Abdomen

## 2023-05-07 MED ORDER — CEFAZOLIN SODIUM-DEXTROSE 2-4 GM/100ML-% IV SOLN
INTRAVENOUS | Status: AC
  Filled 2023-05-07: qty 100

## 2023-05-07 MED ORDER — OXYCODONE HCL 5 MG/5ML PO SOLN
5.0000 mg | Freq: Once | ORAL | Status: DC | PRN
Start: 2023-05-07 — End: 2023-05-07

## 2023-05-07 MED ORDER — GLYCOPYRROLATE PF 0.2 MG/ML IJ SOSY
PREFILLED_SYRINGE | INTRAMUSCULAR | Status: AC
  Filled 2023-05-07: qty 1

## 2023-05-07 MED ORDER — CEFAZOLIN SODIUM-DEXTROSE 2-4 GM/100ML-% IV SOLN
2.0000 g | INTRAVENOUS | Status: AC
Start: 2023-05-07 — End: 2023-05-07
  Administered 2023-05-07: 2 g via INTRAVENOUS

## 2023-05-07 MED ORDER — SODIUM CHLORIDE 0.9 % IV SOLN: INTRAVENOUS | Status: DC | PRN

## 2023-05-07 MED ORDER — TRAMADOL HCL 50 MG PO TABS: 50.0000 mg | ORAL_TABLET | Freq: Four times a day (QID) | ORAL | 0 refills | Status: AC | PRN

## 2023-05-07 MED ORDER — FENTANYL CITRATE (PF) 100 MCG/2ML IJ SOLN
INTRAMUSCULAR | Status: AC
  Filled 2023-05-07: qty 2

## 2023-05-07 MED ORDER — OXYCODONE HCL 5 MG PO TABS: 5.0000 mg | ORAL_TABLET | Freq: Once | ORAL | Status: DC | PRN

## 2023-05-07 MED ORDER — ACETAMINOPHEN 500 MG PO TABS
1000.0000 mg | ORAL_TABLET | ORAL | Status: AC
Start: 2023-05-07 — End: 2023-05-07
  Administered 2023-05-07: 1000 mg via ORAL

## 2023-05-07 MED ORDER — FENTANYL CITRATE (PF) 100 MCG/2ML IJ SOLN: 25.0000 ug | INTRAMUSCULAR | Status: DC | PRN

## 2023-05-07 MED ORDER — ACETAMINOPHEN 500 MG PO TABS
ORAL_TABLET | ORAL | Status: AC
  Filled 2023-05-07: qty 2

## 2023-05-07 MED ORDER — MIDAZOLAM HCL 2 MG/2ML IJ SOLN
INTRAMUSCULAR | Status: AC
  Filled 2023-05-07: qty 2

## 2023-05-07 MED ORDER — LIDOCAINE 2% (20 MG/ML) 5 ML SYRINGE
INTRAMUSCULAR | Status: DC | PRN
  Administered 2023-05-07: 100 mg via INTRAVENOUS

## 2023-05-07 MED ORDER — LIDOCAINE 2% (20 MG/ML) 5 ML SYRINGE
INTRAMUSCULAR | Status: AC
  Filled 2023-05-07: qty 5

## 2023-05-07 MED ORDER — DEXAMETHASONE SODIUM PHOSPHATE 4 MG/ML IJ SOLN
INTRAMUSCULAR | Status: DC | PRN
  Administered 2023-05-07: 4 mg via INTRAVENOUS

## 2023-05-07 MED ORDER — BUPIVACAINE-EPINEPHRINE 0.5% -1:200000 IJ SOLN
INTRAMUSCULAR | Status: DC | PRN
  Administered 2023-05-07: 7 mL

## 2023-05-07 MED ORDER — MIDAZOLAM HCL 5 MG/5ML IJ SOLN
INTRAMUSCULAR | Status: DC | PRN
  Administered 2023-05-07: 2 mg via INTRAVENOUS

## 2023-05-07 MED ORDER — ONDANSETRON HCL 4 MG/2ML IJ SOLN
INTRAMUSCULAR | Status: DC | PRN
  Administered 2023-05-07: 4 mg via INTRAVENOUS

## 2023-05-07 MED ORDER — AMISULPRIDE (ANTIEMETIC) 5 MG/2ML IV SOLN: 10.0000 mg | Freq: Once | INTRAVENOUS | Status: DC | PRN

## 2023-05-07 MED ORDER — FENTANYL CITRATE (PF) 100 MCG/2ML IJ SOLN
INTRAMUSCULAR | Status: DC | PRN
  Administered 2023-05-07 (×2): 50 ug via INTRAVENOUS

## 2023-05-07 MED ORDER — LACTATED RINGERS IV SOLN: INTRAVENOUS | Status: DC

## 2023-05-07 MED ORDER — PROPOFOL 10 MG/ML IV BOLUS
INTRAVENOUS | Status: DC | PRN
  Administered 2023-05-07: 200 mg via INTRAVENOUS

## 2023-05-07 MED ORDER — ONDANSETRON HCL 4 MG/2ML IJ SOLN
INTRAMUSCULAR | Status: AC
  Filled 2023-05-07: qty 2

## 2023-05-07 SURGICAL SUPPLY — 30 items
BLADE CLIPPER SURG (BLADE) ×1 IMPLANT
BLADE SURG 15 STRL LF DISP TIS (BLADE) ×2 IMPLANT
CANISTER SUCT 1200ML W/VALVE (MISCELLANEOUS) IMPLANT
CHLORAPREP W/TINT 26 (MISCELLANEOUS) ×2 IMPLANT
COVER BACK TABLE 60X90IN (DRAPES) ×2 IMPLANT
COVER MAYO STAND STRL (DRAPES) ×2 IMPLANT
DERMABOND ADVANCED .7 DNX12 (GAUZE/BANDAGES/DRESSINGS) ×2 IMPLANT
DRAPE LAPAROTOMY 100X72 PEDS (DRAPES) ×2 IMPLANT
DRAPE UTILITY XL STRL (DRAPES) ×2 IMPLANT
ELECT REM PT RETURN 9FT ADLT (ELECTROSURGICAL) ×2
ELECTRODE REM PT RTRN 9FT ADLT (ELECTROSURGICAL) ×1 IMPLANT
GLOVE SURG SIGNA 7.5 PF LTX (GLOVE) ×2 IMPLANT
GOWN STRL REUS W/ TWL LRG LVL3 (GOWN DISPOSABLE) ×2 IMPLANT
GOWN STRL REUS W/ TWL XL LVL3 (GOWN DISPOSABLE) ×2 IMPLANT
NDL HYPO 25X1 1.5 SAFETY (NEEDLE) ×1 IMPLANT
NEEDLE HYPO 25X1 1.5 SAFETY (NEEDLE) ×2
NS IRRIG 1000ML POUR BTL (IV SOLUTION) ×1 IMPLANT
PACK BASIN DAY SURGERY FS (CUSTOM PROCEDURE TRAY) ×2 IMPLANT
PENCIL SMOKE EVACUATOR (MISCELLANEOUS) ×2 IMPLANT
SLEEVE SCD COMPRESS KNEE MED (STOCKING) ×1 IMPLANT
SPIKE FLUID TRANSFER (MISCELLANEOUS) IMPLANT
SPONGE T-LAP 4X18 ~~LOC~~+RFID (SPONGE) ×2 IMPLANT
SUT MNCRL AB 4-0 PS2 18 (SUTURE) ×1 IMPLANT
SUT VIC AB 2-0 SH 27XBRD (SUTURE) IMPLANT
SUT VIC AB 3-0 SH 27X BRD (SUTURE) ×1 IMPLANT
SYR BULB EAR ULCER 3OZ GRN STR (SYRINGE) IMPLANT
SYR CONTROL 10ML LL (SYRINGE) ×2 IMPLANT
TOWEL GREEN STERILE FF (TOWEL DISPOSABLE) ×2 IMPLANT
TUBE CONNECTING 20X1/4 (TUBING) IMPLANT
YANKAUER SUCT BULB TIP NO VENT (SUCTIONS) IMPLANT

## 2023-05-07 NOTE — Discharge Instructions (Addendum)
You may shower starting tomorrow  No vigorous activity for one week  Ice pack, tylenol, and ibuprofen also for pain   No Tylenol before 1:10pm if needed.   Post Anesthesia Home Care Instructions  Activity: Get plenty of rest for the remainder of the day. A responsible individual must stay with you for 24 hours following the procedure.  For the next 24 hours, DO NOT: -Drive a car -Advertising copywriter -Drink alcoholic beverages -Take any medication unless instructed by your physician -Make any legal decisions or sign important papers.  Meals: Start with liquid foods such as gelatin or soup. Progress to regular foods as tolerated. Avoid greasy, spicy, heavy foods. If nausea and/or vomiting occur, drink only clear liquids until the nausea and/or vomiting subsides. Call your physician if vomiting continues.  Special Instructions/Symptoms: Your throat may feel dry or sore from the anesthesia or the breathing tube placed in your throat during surgery. If this causes discomfort, gargle with warm salt water. The discomfort should disappear within 24 hours.

## 2023-05-07 NOTE — Op Note (Addendum)
EXCISION OF ABDOMINAL WALL STITCH GRANULOMA  Procedure Note  Anthony Wilson 05/07/2023   Pre-op Diagnosis: STITCH GRANULOMA     Post-op Diagnosis: same  Procedure(s): EXCISION OF ABDOMINAL WALL STITCH GRANULOMA (1 cm)  Surgeon(s): Abigail Miyamoto, MD  Anesthesia: General  Staff:  Circulator: Maryan Rued, RN Scrub Person: Griffin Basil, RN  Estimated Blood Loss: Minimal               Findings: The patient was found to have a single Novafil suture creating a stitch granuloma that was 1 cm from his previous umbilical hernia repair with mesh in 2020.  There was no evidence of infection  Procedure: The patient was brought to the operating identifies correct patient.  He was placed upon the operating table and general anesthesia was induced.  His abdomen was prepped and draped in usual sterile fashion.  I anesthetized the skin around the raised area just above the umbilicus from the stitch granuloma with Marcaine.  I then performed elliptical incision with a scalpel.  I did this down through subcutaneous tissue and identified a single Novafil suture which was creating the granuloma.  I excised the suture with scissors.  I then closed the subcutaneous tissue with interrupted 3-0 Vicryl sutures and closed the skin with a running 4-0 Monocryl.  Dermabond was then applied.  The patient tolerated the procedure well.  All the counts were correct at the end of the procedure.  The patient was then extubated in the operating room and taken in a stable condition to the recovery room.          Abigail Miyamoto   Date: 05/07/2023  Time: 9:22 AM

## 2023-05-07 NOTE — Anesthesia Procedure Notes (Signed)
Procedure Name: LMA Insertion Date/Time: 05/07/2023 9:03 AM  Performed by: [name], CRNAPre-anesthesia Checklist: Patient identified, Emergency Drugs available, Suction available and Patient being monitored Patient Re-evaluated:Patient Re-evaluated prior to induction Oxygen Delivery Method: Circle system utilized Preoxygenation: Pre-oxygenation with 100% oxygen Induction Type: IV induction Ventilation: Mask ventilation without difficulty LMA: LMA inserted LMA Size: 4.0 Number of attempts: 1 Placement Confirmation: positive ETCO2 and breath sounds checked- equal and bilateral Tube secured with: Tape Dental Injury: Teeth and Oropharynx as per pre-operative assessment

## 2023-05-07 NOTE — Transfer of Care (Signed)
Immediate Anesthesia Transfer of Care Note  Patient: Anthony Wilson  Procedure(s) Performed: EXCISION OF ABDOMINAL WALL STITCH GRANULOMA (Abdomen)  Patient Location: PACU  Anesthesia Type:General  Level of Consciousness: sedated  Airway & Oxygen Therapy: Patient Spontanous Breathing and Patient connected to nasal cannula oxygen  Post-op Assessment: Report given to RN and Post -op Vital signs reviewed and stable  Post vital signs: Reviewed and stable  Last Vitals:  Vitals Value Taken Time  BP 105/67 05/07/23 0924  Temp    Pulse 80 05/07/23 0925  Resp 12 05/07/23 0925  SpO2 92 % 05/07/23 0925  Vitals shown include unfiled device data.  Last Pain:  Vitals:   05/07/23 0708  TempSrc: Temporal  PainSc: 0-No pain         Complications: No notable events documented.

## 2023-05-07 NOTE — Anesthesia Postprocedure Evaluation (Signed)
Anesthesia Post Note  Patient: Anthony Wilson  Procedure(s) Performed: EXCISION OF ABDOMINAL WALL STITCH GRANULOMA (Abdomen)     Patient location during evaluation: PACU Anesthesia Type: General Level of consciousness: awake Pain management: pain level controlled Vital Signs Assessment: post-procedure vital signs reviewed and stable Respiratory status: spontaneous breathing, nonlabored ventilation and respiratory function stable Cardiovascular status: blood pressure returned to baseline and stable Postop Assessment: no apparent nausea or vomiting Anesthetic complications: no   No notable events documented.  Last Vitals:  Vitals:   05/07/23 0945 05/07/23 0958  BP: 111/76   Pulse: 74 73  Resp: 10   Temp:    SpO2: 93%     Last Pain:  Vitals:   05/07/23 0945  TempSrc:   PainSc: 0-No pain                 Linton Rump

## 2023-05-08 ENCOUNTER — Encounter (HOSPITAL_BASED_OUTPATIENT_CLINIC_OR_DEPARTMENT_OTHER): Payer: Self-pay | Admitting: Surgery

## 2023-10-15 ENCOUNTER — Other Ambulatory Visit: Payer: Self-pay

## 2024-06-09 ENCOUNTER — Other Ambulatory Visit: Payer: Self-pay

## 2024-06-10 ENCOUNTER — Other Ambulatory Visit: Payer: Self-pay

## 2024-06-10 MED ORDER — ATORVASTATIN CALCIUM 20 MG PO TABS
20.0000 mg | ORAL_TABLET | Freq: Every day | ORAL | 3 refills | Status: AC
  Filled 2024-06-10 – 2024-06-12 (×2): qty 90, 90d supply, fill #0

## 2024-06-10 MED ORDER — TESTOSTERONE CYPIONATE 200 MG/ML IM SOLN
200.0000 mg | INTRAMUSCULAR | 3 refills | Status: AC
  Filled 2024-06-10: qty 3, 84d supply, fill #0

## 2024-06-10 MED ORDER — METOPROLOL SUCCINATE ER 50 MG PO TB24
50.0000 mg | ORAL_TABLET | Freq: Every day | ORAL | 2 refills | Status: AC
  Filled 2024-06-10: qty 90, 90d supply, fill #0

## 2024-06-10 MED ORDER — METOPROLOL SUCCINATE ER 50 MG PO TB24
50.0000 mg | ORAL_TABLET | Freq: Every day | ORAL | 1 refills | Status: AC
  Filled 2024-06-10 – 2024-06-12 (×2): qty 90, 90d supply, fill #0

## 2024-06-10 MED ORDER — AMLODIPINE-VALSARTAN-HCTZ 10-160-12.5 MG PO TABS
1.0000 | ORAL_TABLET | Freq: Every day | ORAL | 2 refills | Status: AC
  Filled 2024-06-10: qty 90, 90d supply, fill #0

## 2024-06-10 MED ORDER — LAMOTRIGINE 200 MG PO TABS
200.0000 mg | ORAL_TABLET | Freq: Every day | ORAL | 1 refills | Status: AC
  Filled 2024-06-10 – 2024-06-12 (×2): qty 90, 90d supply, fill #0

## 2024-06-10 MED ORDER — AMLODIPINE-VALSARTAN-HCTZ 10-160-12.5 MG PO TABS
1.0000 | ORAL_TABLET | Freq: Every day | ORAL | 3 refills | Status: AC
  Filled 2024-06-10 – 2024-06-12 (×2): qty 90, 90d supply, fill #0

## 2024-06-10 MED ORDER — METFORMIN HCL 1000 MG PO TABS
1000.0000 mg | ORAL_TABLET | Freq: Every day | ORAL | 3 refills | Status: AC
  Filled 2024-06-10 – 2024-06-12 (×2): qty 90, 90d supply, fill #0

## 2024-06-10 MED ORDER — CITALOPRAM HYDROBROMIDE 20 MG PO TABS
20.0000 mg | ORAL_TABLET | Freq: Every day | ORAL | 0 refills | Status: DC
  Filled 2024-06-10 – 2024-06-13 (×4): qty 7, 7d supply, fill #0

## 2024-06-10 MED ORDER — APIXABAN 5 MG PO TABS
5.0000 mg | ORAL_TABLET | Freq: Two times a day (BID) | ORAL | 3 refills | Status: AC
  Filled 2024-06-10: qty 180, 90d supply, fill #0

## 2024-06-10 MED ORDER — TIRZEPATIDE 5 MG/0.5ML SC SOAJ
5.0000 mg | SUBCUTANEOUS | 5 refills | Status: AC
  Filled 2024-06-10 – 2024-06-12 (×3): qty 2, 28d supply, fill #0

## 2024-06-10 MED ORDER — FLECAINIDE ACETATE 100 MG PO TABS
100.0000 mg | ORAL_TABLET | Freq: Two times a day (BID) | ORAL | 1 refills | Status: AC
  Filled 2024-06-10 – 2024-06-12 (×2): qty 180, 90d supply, fill #0

## 2024-06-10 NOTE — Progress Notes (Signed)
 Specialty Pharmacy Initial Fill Note    Raymond Austin is a 59 y.o. male, who is being followed by [facility] team for management of: RxSp Diabetes (Enrolled) for the following services:  Benefits and PA Management  Team Member  Refill Management  Clinical Management    Medications monitored by Specialty Pharmacy:   tirzepatide  (MOUNJARO ) 5 MG/0.5ML injection   Inject 5 mg into the skin once a week      Initial Fill  Medications Linked to Program: Tirzepatide  (MOUNJARO ) (2ML/28DS $25 Copay ins + copay card, + maint meds, CMM sch 2:10 @ 330 w/ CC Rph)  HIPAA verified (patient name & dob or patient name & address) with approved contact: Yes  HIPAA verification: please specify: DOB  Changes to medications, herbals or supplements?: No  Delivery method: Courier  Preferred time?: Anytime  Delivery confirmation: delivery address: Delivery Address Reviewed & Accurate  Enter delivery address: [address] [place]  [place] [address]  Delivery phone number: [phone]  Delivery confirmation: signature required for delivery: No  Delivery confirmation: patient informed of and confirms delivery date. Enter Delivery date: : 06/16/24  Supplies needed?: Alcohol swabs; Bandages; Welcome Packet  Does patient have any concerns about safely storing medications (at the correct temperature, away from children/pets,etc.)?: No  Do any medications need mixed or dated?: No  Financial confirmation: patient informed of financial responsibility and copay amount: Yes  Financial responsibility: copay amount: $25 w/ ins + copay card for Mounjaro  plus maint. meds  Patient is informed and has agreed to opt into [facility] Patient Management Program: Yes  Delivery closing: any immediate questions for the pharmacist: No  Additional assessment notes : CMM sch 2/10 @ 3:30 w/ CC Rph for remaining refills  Did patient agree to fill?: Yes        Medicare Part B Fill? No    Raymond Austin

## 2024-06-10 NOTE — Progress Notes (Signed)
 Benefits Investigation Summary  Medication: Tirzepatide  (MOUNJARO )     Prescription: Renewal-1st fill from [employer]    Benefits Outcome: Paid     Estimated Rx Copay:  $25 (with insurance and copay card)     Additional Notes:Team Member    Next Step: A designated liaison will contact the patient within 24-48 business hours        :

## 2024-06-10 NOTE — Progress Notes (Signed)
 Prior Authorization Approval Details    Which medication is the prior authorization for? Mounjaro  5mg        What is the patient's pharmacy benefit? [employer]       Prior Auth Status: Initial Approved       PA Number: N/A       PA Start Date: 10/07/23       PA Expiration Date: 10/06/24       Estimated Rx Copay: $25/28d

## 2024-06-12 ENCOUNTER — Other Ambulatory Visit: Payer: Self-pay

## 2024-06-13 ENCOUNTER — Other Ambulatory Visit: Payer: Self-pay

## 2024-06-14 ENCOUNTER — Other Ambulatory Visit: Payer: Self-pay

## 2024-06-15 ENCOUNTER — Other Ambulatory Visit: Payer: Self-pay

## 2024-06-17 ENCOUNTER — Other Ambulatory Visit: Payer: Self-pay

## 2024-06-17 MED ORDER — CITALOPRAM HYDROBROMIDE 20 MG PO TABS
20.0000 mg | ORAL_TABLET | Freq: Every day | ORAL | 0 refills | Status: AC
  Filled 2024-06-17: qty 30, 30d supply, fill #0

## 2024-06-17 MED ORDER — CITALOPRAM HYDROBROMIDE 20 MG PO TABS
ORAL_TABLET | ORAL | 0 refills | Status: AC
  Filled 2024-06-17 (×2): qty 90, 90d supply, fill #0

## 2024-06-20 ENCOUNTER — Other Ambulatory Visit: Payer: Self-pay

## 2024-06-21 ENCOUNTER — Other Ambulatory Visit: Payer: Self-pay

## 2024-06-23 ENCOUNTER — Other Ambulatory Visit: Payer: Self-pay

## 2024-07-05 ENCOUNTER — Telehealth
